# Patient Record
Sex: Male | Born: 1995
Health system: Southern US, Community
[De-identification: ages and names within clinical notes are randomized; demographics above are authoritative.]

## PROBLEM LIST (undated history)

## (undated) DIAGNOSIS — F988 Other specified behavioral and emotional disorders with onset usually occurring in childhood and adolescence: Secondary | ICD-10-CM

## (undated) HISTORY — DX: Other specified behavioral and emotional disorders with onset usually occurring in childhood and adolescence: F98.8

## (undated) HISTORY — PX: NO PAST SURGERIES: SHX2092

---
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
## 2016-07-15 ENCOUNTER — Encounter: Payer: Self-pay | Admitting: Family Medicine

## 2016-07-15 ENCOUNTER — Ambulatory Visit (INDEPENDENT_AMBULATORY_CARE_PROVIDER_SITE_OTHER): Payer: BLUE CROSS/BLUE SHIELD | Admitting: Family Medicine

## 2016-07-15 VITALS — BP 121/69 | HR 67 | Temp 98.0°F | Ht 74.6 in | Wt 177.0 lb

## 2016-07-15 DIAGNOSIS — M545 Low back pain: Secondary | ICD-10-CM | POA: Diagnosis not present

## 2016-07-15 DIAGNOSIS — F411 Generalized anxiety disorder: Secondary | ICD-10-CM | POA: Diagnosis not present

## 2016-07-15 DIAGNOSIS — F329 Major depressive disorder, single episode, unspecified: Secondary | ICD-10-CM | POA: Diagnosis not present

## 2016-07-15 DIAGNOSIS — F32A Depression, unspecified: Secondary | ICD-10-CM | POA: Insufficient documentation

## 2016-07-15 DIAGNOSIS — R3911 Hesitancy of micturition: Secondary | ICD-10-CM

## 2016-07-15 DIAGNOSIS — F419 Anxiety disorder, unspecified: Secondary | ICD-10-CM | POA: Insufficient documentation

## 2016-07-15 MED ORDER — CITALOPRAM HYDROBROMIDE 20 MG PO TABS: ORAL_TABLET | ORAL | 1 refills | Status: DC

## 2016-07-15 NOTE — Assessment & Plan Note (Signed)
Does not want counseling at this time. Will start celexa. Risks and benefits discussed. Recheck in 2 weeks. Call with concerns.

## 2016-07-15 NOTE — Progress Notes (Signed)
BP 121/69 (BP Location: Left Arm, Patient Position: Sitting, Cuff Size: Large)   Pulse 67   Temp 98 F (36.7 C)   Ht 6' 2.6" (1.895 m)   Wt 177 lb (80.3 kg)   SpO2 97%   BMI 22.36 kg/m    Subjective:    Patient ID: Anthony Wade, male    DOB: 12-28-1995, 20 y.o.   MRN: [id number]  HPI: Anthony Wade is a 20 y.o. male  Chief Complaint  Patient presents with  . Depression  . Anxiety  . Back Pain  . decreased urination   DEPRESSION/ANXIETY- been going on since Middle school Mood status: exacerbated Satisfied with current treatment?: no Symptom severity: moderate  Psychotherapy/counseling: yes in the past- didn't like it Depressed mood: yes Anxious mood: yes Anhedonia: yes Significant weight loss or gain: no Insomnia: no  Fatigue: yes Feelings of worthlessness or guilt: yes Impaired concentration/indecisiveness: yes Suicidal ideations: yes- passive, no plan Hopelessness: yes Crying spells: yes Depression screen PHQ 2/9 07/15/2016  Decreased Interest 1  Down, Depressed, Hopeless 2  PHQ - 2 Score 3  Altered sleeping 3  Tired, decreased energy 2  Change in appetite 2  Feeling bad or failure about yourself  1  Trouble concentrating 0  Moving slowly or fidgety/restless 1  Suicidal thoughts 1  PHQ-9 Score 13   GAD 7 : Generalized Anxiety Score 07/15/2016  Nervous, Anxious, on Edge 2  Control/stop worrying 2  Worry too much - different things 2  Trouble relaxing 3  Restless 2  Easily annoyed or irritable 3  Afraid - awful might happen 0  Total GAD 7 Score 14  Anxiety Difficulty Somewhat difficult     BACK PAIN Duration: 4 years, worse over the past couple of years Mechanism of injury: no trauma Location: bilateral and low back Onset: gradual Severity: mild Quality: aching Frequency: intermittent Radiation: none Aggravating factors: none Alleviating factors: nothing Status: stable Treatments attempted: none  Relief with NSAIDs?: No NSAIDs  Taken Nighttime pain:  no Paresthesias / decreased sensation:  no Bowel / bladder incontinence:  no Fevers:  no Dysuria / urinary frequency:  no  URINARY SYMPTOMS Duration: several weeks Dysuria: burning Urinary frequency: no Urgency: no Small volume voids: no Symptom severity: moderate Urinary incontinence: no Foul odor: yes Hematuria: no Abdominal pain: no Back pain: yes Suprapubic pain/pressure: no Flank pain: no Fever:  no Vomiting: no Status: stable Previous urinary tract infection: no Recurrent urinary tract infection: no Penile discharge: no Treatments attempted: none   Active Ambulatory Problems    Diagnosis Date Noted  . Depression 07/15/2016  . Anxiety disorder 07/15/2016   Resolved Ambulatory Problems    Diagnosis Date Noted  . Acute anxiety 07/15/2016   Past Medical History:  Diagnosis Date  . ADD (attention deficit disorder)    No Known Allergies  History reviewed. No pertinent surgical history.  Social History   Social History  . Marital status: Single    Spouse name: N/A  . Number of children: N/A  . Years of education: N/A   Occupational History  . Not on file.   Social History Main Topics  . Smoking status: Current Every Day Smoker    Packs/day: 0.25    Types: Cigarettes  . Smokeless tobacco: Never Used  . Alcohol use Yes     Comment: On occasion  . Drug use:     Types: Marijuana  . Sexual activity: No   Other Topics Concern  . Not on file  Social History Narrative  . No narrative on file   Family History  Problem Relation Age of Onset  . Asthma Mother   . Mental illness Mother     anxiety, bipolar depression  . Intestinal malrotation Mother   . ADD / ADHD Sister   . ADD / ADHD Brother   . Diabetes Maternal Grandmother   . Alcohol abuse Maternal Grandfather      Review of Systems  Constitutional: Negative.   Respiratory: Negative.   Cardiovascular: Negative.   Genitourinary: Positive for decreased urine  volume, difficulty urinating and dysuria. Negative for discharge, enuresis, flank pain, frequency, genital sores, hematuria, penile pain, penile swelling, scrotal swelling, testicular pain and urgency.  Musculoskeletal: Positive for back pain and myalgias. Negative for arthralgias, gait problem, joint swelling, neck pain and neck stiffness.  Psychiatric/Behavioral: Positive for decreased concentration and dysphoric mood. Negative for agitation, behavioral problems, confusion, hallucinations, self-injury, sleep disturbance and suicidal ideas. The patient is nervous/anxious. The patient is not hyperactive.     Per HPI unless specifically indicated above     Objective:    BP 121/69 (BP Location: Left Arm, Patient Position: Sitting, Cuff Size: Large)   Pulse 67   Temp 98 F (36.7 C)   Ht 6' 2.6" (1.895 m)   Wt 177 lb (80.3 kg)   SpO2 97%   BMI 22.36 kg/m   Wt Readings from Last 3 Encounters:  07/15/16 177 lb (80.3 kg) (81 %, Z= 0.88)*   * Growth percentiles are based on CDC 2-20 Years data.    Physical Exam  Constitutional: He is oriented to person, place, and time. He appears well-developed and well-nourished. No distress.  HENT:  Head: Normocephalic and atraumatic.  Right Ear: Hearing normal.  Left Ear: Hearing normal.  Nose: Nose normal.  Eyes: Conjunctivae and lids are normal. Right eye exhibits no discharge. Left eye exhibits no discharge. No scleral icterus.  Cardiovascular: Normal rate, regular rhythm, normal heart sounds and intact distal pulses.  Exam reveals no gallop and no friction rub.   No murmur heard. Pulmonary/Chest: Effort normal and breath sounds normal. No respiratory distress. He has no wheezes. He has no rales. He exhibits no tenderness.  Neurological: He is alert and oriented to person, place, and time.  Skin: Skin is warm, dry and intact. No rash noted. He is not diaphoretic. No erythema. No pallor.  Psychiatric: He has a normal mood and affect. His speech is  normal and behavior is normal. Judgment and thought content normal. Cognition and memory are normal.  Nursing note and vitals reviewed. Back Exam:    Inspection:    Curvature: Scoliotic- L lumbar  Deformity: no  Ecchymosis: no   Erythema:  no   Lesions: no    Palpation:     Midline spinal tenderness: no      Paralumbar tenderness: yes      Parathoracic tenderness: no      Buttocks tenderness: no     Range of Motion:      Flexion: Fingers to Knees     Extension:Decreased     Lateral bending:Decreased    Rotation:Decreased    Neuro Exam:Lower extremity DTRs normal & symmetric.  Strength and sensation intact.    Special Tests:      Straight leg raise:negative  Results for orders placed or performed in visit on 12/09/12  CBC  Result Value Ref Range   WBC 6.1 3.8 - 10.6 x10 3/mm 3   RBC 5.16 4.40 - 5.90  x10 6/mm 3   HGB 14.9 13.0 - 18.0 g/dL   HCT 40.942.4 81.140.0 - 91.452.0 %   MCV 82 80 - 100 fL   MCH 28.9 26.0 - 34.0 pg   MCHC 35.1 32.0 - 36.0 g/dL   RDW 78.213.7 95.611.5 - 21.314.5 %   Platelet 198 150 - 440 x10 3/mm 3  Comprehensive metabolic panel  Result Value Ref Range   Glucose 104 (H) 65 - 99 mg/dL   BUN 21 9 - 21 mg/dL   Creatinine 0.860.89 5.780.60 - 1.30 mg/dL   Sodium 469142 (H) 629132 - 141 mmol/L   Potassium 4.0 3.3 - 4.7 mmol/L   Chloride 105 97 - 107 mmol/L   Co2 30 (H) 16 - 25 mmol/L   Calcium, Total 9.2 (L) 9.3 - 10.7 mg/dL   SGOT(AST) 25 15 - 37 Unit/L   SGPT (ALT) 20 12 - 78 U/L   Alkaline Phosphatase 220 169 - 618 Unit/L   Albumin 4.2 3.8 - 5.6 g/dL   Total Protein 7.8 6.4 - 8.6 g/dL   Bilirubin,Total 0.4 0.2 - 1.0 mg/dL   Osmolality 528286 413275 - 301   Anion Gap 7 7 - 16  Ethanol  Result Value Ref Range   Ethanol < 3 mg/dL   Ethanol % < 2.4400.003 1.0270.000 - 0.080 %  TSH  Result Value Ref Range   Thyroid Stimulating Horm 2.52 uIU/mL  Urinalysis, Complete  Result Value Ref Range   Color - urine Yellow    Clarity - urine Clear    Glucose,UR Negative 0 - 75 mg/dL   Bilirubin,UR  Negative NEGATIVE   Ketone Negative NEGATIVE   Specific Gravity 1.014 1.003 - 1.030   Blood Negative NEGATIVE   Ph 6.0 4.5 - 8.0   Protein Negative NEGATIVE   Nitrite Negative NEGATIVE   Leukocyte Esterase Negative NEGATIVE   RBC,UR <1 /HPF 0 - 5 /HPF   WBC UR 1 /HPF 0 - 5 /HPF   Bacteria NONE SEEN NONE SEEN   Squamous Epithelial NONE SEEN    Mucous PRESENT   Drug Screen, Urine  Result Value Ref Range   Tricyclic, Ur Screen NEGATIVE Cutoff-1000 ng/mL   Amphetamines, Ur Screen NEGATIVE Cutoff-1000 ng/mL   MDMA (Ecstasy)Ur Screen NEGATIVE Cutoff-500 ng/mL   Cocaine Metabolite,Ur [place] NEGATIVE Cutoff-300 ng/mL   Opiate, Ur Screen NEGATIVE Cutoff-300 ng/mL   Phencyclidine (PCP) Ur S NEGATIVE Cutoff-25 ng/mL   Cannabinoid 50 Ng, Ur [place] NEGATIVE Cutoff-50 ng/mL   Barbiturates, Ur Screen NEGATIVE Cutoff-200 ng/mL   Methadone, Ur Screen NEGATIVE Cutoff-300 ng/mL   Benzodiazepine, Ur Scrn NEGATIVE Cutoff-200 ng/mL      Assessment & Plan:   Problem List Items Addressed This Visit      Other   Depression    Does not want counseling at this time. Will start celexa. Risks and benefits discussed. Recheck in 2 weeks. Call with concerns.       Relevant Medications   citalopram (CELEXA) 20 MG tablet   Anxiety disorder    Does not want counseling at this time. Will start celexa. Risks and benefits discussed. Recheck in 2 weeks. Call with concerns.        Other Visit Diagnoses    Urinary hesitancy    -  Primary   Will check UA and GC/chlamydia. Increase water intake. Follow up in 2 weeks.    Relevant Orders   UA/M w/rflx Culture, Routine   GC/Chlamydia Probe Amp   Low back pain without sciatica,  unspecified back pain laterality       Looks like he has some scoliosis. Will check x-ray. Stretches given. Call with concerns.    Relevant Orders   DG Lumbar Spine Complete   UA/M w/rflx Culture, Routine   GC/Chlamydia Probe Amp       Follow up plan: Return in about 2 weeks (around  07/29/2016) for follow up mood.

## 2016-07-15 NOTE — Assessment & Plan Note (Signed)
Does not want counseling at this time. Will start celexa. Risks and benefits discussed. Recheck in 2 weeks. Call with concerns.  

## 2016-07-15 NOTE — Patient Instructions (Addendum)

## 2016-07-23 ENCOUNTER — Ambulatory Visit
Admission: RE | Admit: 2016-07-23 | Discharge: 2016-07-23 | Disposition: A | Discharge status: Home / Self Care | Payer: BLUE CROSS/BLUE SHIELD | Source: Ambulatory Visit | Attending: Family Medicine | Admitting: Family Medicine

## 2016-07-23 DIAGNOSIS — M545 Low back pain: Secondary | ICD-10-CM | POA: Insufficient documentation

## 2016-07-24 ENCOUNTER — Encounter: Payer: Self-pay | Admitting: Family Medicine

## 2016-07-29 ENCOUNTER — Encounter: Payer: Self-pay | Admitting: Family Medicine

## 2016-07-29 ENCOUNTER — Ambulatory Visit (INDEPENDENT_AMBULATORY_CARE_PROVIDER_SITE_OTHER): Payer: BLUE CROSS/BLUE SHIELD | Admitting: Family Medicine

## 2016-07-29 DIAGNOSIS — F329 Major depressive disorder, single episode, unspecified: Secondary | ICD-10-CM

## 2016-07-29 DIAGNOSIS — F1220 Cannabis dependence, uncomplicated: Secondary | ICD-10-CM | POA: Diagnosis not present

## 2016-07-29 DIAGNOSIS — F32A Depression, unspecified: Secondary | ICD-10-CM

## 2016-07-29 NOTE — Assessment & Plan Note (Addendum)
Not doing well. Smoking at lot. Took too much of his medicine. Unclear on safety. Mobile Crisis Unit called and Anthony Wade came out to do an assessment. Discussed with Mom, Step-Dad and [name]. Not a danger to himself or others at this time. No need for IVC. List of counselors given to patient and his family today. Patient to continue his celexa as long as parents ar in control of bottle and give it to him as prescribed. Will keep very close follow up with appointment in 1 week.

## 2016-07-29 NOTE — Assessment & Plan Note (Signed)
Also using drugs and xanax in the past. Parents want him to go to rehab. Mobile Crisis Unit called today.

## 2016-07-29 NOTE — Progress Notes (Signed)
BP 125/78 (BP Location: Left Arm, Patient Position: Sitting, Cuff Size: Normal)   Pulse 74   Temp 97.7 F (36.5 C)   Wt 183 lb (83 kg)   SpO2 99%   BMI 23.12 kg/m    Subjective:    Patient ID: Anthony Wade, male    DOB: 03-15-1996, 20 y.o.   MRN: [id number]  HPI: Anthony Wade is a 20 y.o. male here today with his Mom  Chief Complaint  Patient presents with  . Depression   DEPRESSION- Mom is very concerned about him. States that the past week has been bad. She took his medicine away from him because he took too much of it. States that last week he took 3-4 of his medicine. He got kicked out of his house on Saturday for not coming home to take care of his sister. Mom notes that he has been smoking at lot. He has been smoking marijuana several times a day and drinking a lot.  Mood status: exacerbated Satisfied with current treatment?: no Symptom severity: severe  Duration of current treatment : 2 weeks Side effects: no Medication compliance: poor compliance Psychotherapy/counseling: no  Previous psychiatric medications: none Depressed mood: yes Anxious mood: yes Anhedonia: yes Significant weight loss or gain: no Insomnia: yes hard to fall asleep Fatigue: yes Feelings of worthlessness or guilt: yes Impaired concentration/indecisiveness: no Suicidal ideations: no Hopelessness: yes Crying spells: no Depression screen [facility] 2/9 07/29/2016 07/15/2016  Decreased Interest 3 1  Down, Depressed, Hopeless 3 2  PHQ - 2 Score 6 3  Altered sleeping 3 3  Tired, decreased energy 3 2  Change in appetite 2 2  Feeling bad or failure about yourself  2 1  Trouble concentrating 2 0  Moving slowly or fidgety/restless 2 1  Suicidal thoughts 0 1  PHQ-9 Score 20 13    Relevant past medical, surgical, family and social history reviewed and updated as indicated. Interim medical history since our last visit reviewed. Allergies and medications reviewed and updated.  Review of Systems    Constitutional: Negative.   Respiratory: Negative.   Cardiovascular: Negative.   Psychiatric/Behavioral: Positive for behavioral problems, dysphoric mood and sleep disturbance. Negative for agitation, confusion, decreased concentration, hallucinations, self-injury and suicidal ideas. The patient is nervous/anxious. The patient is not hyperactive.     Per HPI unless specifically indicated above     Objective:    BP 125/78 (BP Location: Left Arm, Patient Position: Sitting, Cuff Size: Normal)   Pulse 74   Temp 97.7 F (36.5 C)   Wt 183 lb (83 kg)   SpO2 99%   BMI 23.12 kg/m   Wt Readings from Last 3 Encounters:  07/29/16 183 lb (83 kg) (85 %, Z= 1.05)*  07/15/16 177 lb (80.3 kg) (81 %, Z= 0.88)*   * Growth percentiles are based on CDC 2-20 Years data.    Physical Exam  Constitutional: He is oriented to person, place, and time. He appears well-developed and well-nourished. No distress.  HENT:  Head: Normocephalic and atraumatic.  Right Ear: Hearing normal.  Left Ear: Hearing normal.  Nose: Nose normal.  Eyes: Conjunctivae and lids are normal. Right eye exhibits no discharge. Left eye exhibits no discharge. No scleral icterus.  Cardiovascular: Normal rate, regular rhythm, normal heart sounds and intact distal pulses.  Exam reveals no gallop and no friction rub.   No murmur heard. Pulmonary/Chest: Effort normal and breath sounds normal. No respiratory distress. He has no wheezes. He has no rales. He  exhibits no tenderness.  Musculoskeletal: Normal range of motion.  Neurological: He is alert and oriented to person, place, and time.  Skin: Skin is warm, dry and intact. No rash noted. No erythema. No pallor.  Psychiatric: His speech is normal and behavior is normal. Judgment and thought content normal. His affect is blunt. Cognition and memory are normal.  Nursing note and vitals reviewed.   Results for orders placed or performed in visit on 12/09/12  CBC  Result Value Ref Range    WBC 6.1 3.8 - 10.6 x10 3/mm 3   RBC 5.16 4.40 - 5.90 x10 6/mm 3   HGB 14.9 13.0 - 18.0 g/dL   HCT 96.042.4 45.440.0 - 09.852.0 %   MCV 82 80 - 100 fL   MCH 28.9 26.0 - 34.0 pg   MCHC 35.1 32.0 - 36.0 g/dL   RDW 11.913.7 14.711.5 - 82.914.5 %   Platelet 198 150 - 440 x10 3/mm 3  Comprehensive metabolic panel  Result Value Ref Range   Glucose 104 (H) 65 - 99 mg/dL   BUN 21 9 - 21 mg/dL   Creatinine 5.620.89 1.300.60 - 1.30 mg/dL   Sodium 865142 (H) 784132 - 141 mmol/L   Potassium 4.0 3.3 - 4.7 mmol/L   Chloride 105 97 - 107 mmol/L   Co2 30 (H) 16 - 25 mmol/L   Calcium, Total 9.2 (L) 9.3 - 10.7 mg/dL   SGOT(AST) 25 15 - 37 Unit/L   SGPT (ALT) 20 12 - 78 U/L   Alkaline Phosphatase 220 169 - 618 Unit/L   Albumin 4.2 3.8 - 5.6 g/dL   Total Protein 7.8 6.4 - 8.6 g/dL   Bilirubin,Total 0.4 0.2 - 1.0 mg/dL   Osmolality 696286 295275 - 301   Anion Gap 7 7 - 16  Ethanol  Result Value Ref Range   Ethanol < 3 mg/dL   Ethanol % < 2.8410.003 3.2440.000 - 0.080 %  TSH  Result Value Ref Range   Thyroid Stimulating Horm 2.52 uIU/mL  Urinalysis, Complete  Result Value Ref Range   Color - urine Yellow    Clarity - urine Clear    Glucose,UR Negative 0 - 75 mg/dL   Bilirubin,UR Negative NEGATIVE   Ketone Negative NEGATIVE   Specific Gravity 1.014 1.003 - 1.030   Blood Negative NEGATIVE   Ph 6.0 4.5 - 8.0   Protein Negative NEGATIVE   Nitrite Negative NEGATIVE   Leukocyte Esterase Negative NEGATIVE   RBC,UR <1 /HPF 0 - 5 /HPF   WBC UR 1 /HPF 0 - 5 /HPF   Bacteria NONE SEEN NONE SEEN   Squamous Epithelial NONE SEEN    Mucous PRESENT   Drug Screen, Urine  Result Value Ref Range   Tricyclic, Ur Screen NEGATIVE Cutoff-1000 ng/mL   Amphetamines, Ur Screen NEGATIVE Cutoff-1000 ng/mL   MDMA (Ecstasy)Ur Screen NEGATIVE Cutoff-500 ng/mL   Cocaine Metabolite,Ur [place] NEGATIVE Cutoff-300 ng/mL   Opiate, Ur Screen NEGATIVE Cutoff-300 ng/mL   Phencyclidine (PCP) Ur S NEGATIVE Cutoff-25 ng/mL   Cannabinoid 50 Ng, Ur [place] NEGATIVE Cutoff-50 ng/mL    Barbiturates, Ur Screen NEGATIVE Cutoff-200 ng/mL   Methadone, Ur Screen NEGATIVE Cutoff-300 ng/mL   Benzodiazepine, Ur Scrn NEGATIVE Cutoff-200 ng/mL      Assessment & Plan:   Problem List Items Addressed This Visit      Other   Depression    Not doing well. Smoking at lot. Took too much of his medicine. Unclear on safety. Mobile Crisis Unit called and  Ree KidaJack came out to do an assessment. Discussed with Mom, Step-Dad and [name]. Not a danger to himself or others at this time. No need for IVC. List of counselors given to patient and his family today. Patient to continue his celexa as long as parents ar in control of bottle and give it to him as prescribed. Will keep very close follow up with appointment in 1 week.       Addiction, marijuana (HCC)    Also using drugs and xanax in the past. Parents want him to go to rehab. Mobile Crisis Unit called today.       Other Visit Diagnoses   None.      Follow up plan: Return in about 1 week (around 08/05/2016) for Follow up.

## 2016-08-04 ENCOUNTER — Ambulatory Visit: Payer: BLUE CROSS/BLUE SHIELD | Admitting: Family Medicine

## 2016-09-05 ENCOUNTER — Encounter: Payer: Self-pay | Admitting: Family Medicine

## 2016-09-05 ENCOUNTER — Ambulatory Visit (INDEPENDENT_AMBULATORY_CARE_PROVIDER_SITE_OTHER): Payer: BLUE CROSS/BLUE SHIELD | Admitting: Family Medicine

## 2016-09-05 DIAGNOSIS — F909 Attention-deficit hyperactivity disorder, unspecified type: Secondary | ICD-10-CM | POA: Diagnosis not present

## 2016-09-05 DIAGNOSIS — F329 Major depressive disorder, single episode, unspecified: Secondary | ICD-10-CM | POA: Diagnosis not present

## 2016-09-05 DIAGNOSIS — F411 Generalized anxiety disorder: Secondary | ICD-10-CM

## 2016-09-05 DIAGNOSIS — F32A Depression, unspecified: Secondary | ICD-10-CM

## 2016-09-05 MED ORDER — ATOMOXETINE HCL 40 MG PO CAPS
ORAL_CAPSULE | ORAL | 1 refills | Status: DC
Start: 2016-09-05 — End: 2019-02-22

## 2016-09-05 NOTE — Assessment & Plan Note (Signed)
Discussed with both [name] and Mom, that I cannot prescribe him controlled substances right now as I know he has been taking pills and using drugs recreationally. I offered him a referral to psychiatry to get his ADD treated vs. Trying a non-controlled medication. He is willing to try strattera and they will decide if they want to see psych. Rx given today. Recheck in 4 weeks to see how he's doing.

## 2016-09-05 NOTE — Assessment & Plan Note (Signed)
Doing well off his medicine. Does not want to take medicine now. Continue to monitor closely.

## 2016-09-05 NOTE — Assessment & Plan Note (Signed)
Doing well off his medicine. Does not want to take medicine now. Continue to monitor closely. 

## 2016-09-05 NOTE — Progress Notes (Addendum)
BP 126/79 (BP Location: Left Arm, Patient Position: Sitting, Cuff Size: Large)   Pulse 71   Temp 97.5 F (36.4 C)   Wt 186 lb (84.4 kg)   SpO2 97%   BMI 23.50 kg/m    Subjective:    Patient ID: Anthony Wade, male    DOB: 01-Feb-1996, 20 y.o.   MRN: [id number]  HPI: Anthony Wade is a 20 y.o. male  Chief Complaint  Patient presents with  . Anxiety  . ADHD    Patients mother would like to discuss ADHD, and the possibilty of starting him on medication again.   ANXIETY/STRESS- doing much better. Living at home now. Feeling good. Off celexa. Made him feel weird Duration:better Anxious mood: yes  Excessive worrying: yes Irritability: yes  Sweating: no Nausea: no Palpitations:no Hyperventilation: no Panic attacks: no Agoraphobia: no  Obscessions/compulsions: no Depressed mood: yes [facility] 09/05/2016 07/29/2016 07/15/2016  Decreased Interest 1 3 1   Down, Depressed, Hopeless 1 3 2   PHQ - 2 Score 2 6 3   Altered sleeping - 3 3  Tired, decreased energy - 3 2  Change in appetite - 2 2  Feeling bad or failure about yourself  - 2 1  Trouble concentrating - 2 0  Moving slowly or fidgety/restless - 2 1  Suicidal thoughts - 0 1  PHQ-9 Score - 20 13   GAD 7 : Generalized Anxiety Score 09/05/2016 07/29/2016 07/15/2016  Nervous, Anxious, on Edge 1 2 2   Control/stop worrying 1 1 2   Worry too much - different things 0 1 2  Trouble relaxing 0 1 3  Restless 0 1 2  Easily annoyed or irritable 2 2 3   Afraid - awful might happen 1 2 0  Total GAD 7 Score 5 10 14   Anxiety Difficulty Not difficult at all Somewhat difficult Somewhat difficult   Anhedonia: no Weight changes: no Insomnia: no   Hypersomnia: no Fatigue/loss of energy: yes Feelings of worthlessness: no Feelings of guilt: no Impaired concentration/indecisiveness: yes Suicidal ideations: no  Crying spells: no Recent Stressors/Life Changes: yes   Financial stress: yes   ADHD- diagnosed in childhood, was  on medication. Has been off medicine for several years. Starting to notice a differnece ADHD status: exacerbated Satisfied with current therapy: no Previous psychiatry evaluation: no Previous medications: yes adderall and ritalin SR   Work/school performance:  fair Difficulty sustaining attention/completing tasks: yes Distracted by extraneous stimuli: yes Does not listen when spoken to: yes  Fidgets with hands or feet: yes Unable to stay in seat: no Blurts out/interrupts others: no  Relevant past medical, surgical, family and social history reviewed and updated as indicated. Interim medical history since our last visit reviewed. Allergies and medications reviewed and updated.  Review of Systems  Constitutional: Negative.   Respiratory: Negative.   Cardiovascular: Negative.   Neurological: Negative.   Psychiatric/Behavioral: Positive for decreased concentration. Negative for agitation, behavioral problems, confusion, dysphoric mood, hallucinations, self-injury, sleep disturbance and suicidal ideas. The patient is nervous/anxious. The patient is not hyperactive.     Per HPI unless specifically indicated above     Objective:    BP 126/79 (BP Location: Left Arm, Patient Position: Sitting, Cuff Size: Large)   Pulse 71   Temp 97.5 F (36.4 C)   Wt 186 lb (84.4 kg)   SpO2 97%   BMI 23.50 kg/m   Wt Readings from Last 3 Encounters:  09/05/16 186 lb (84.4 kg) (87 %, Z= 1.13)*  07/29/16 183 lb (  83 kg) (85 %, Z= 1.05)*  07/15/16 177 lb (80.3 kg) (81 %, Z= 0.88)*   * Growth percentiles are based on CDC 2-20 Years data.    Physical Exam  Constitutional: He is oriented to person, place, and time. He appears well-developed and well-nourished. No distress.  HENT:  Head: Normocephalic and atraumatic.  Right Ear: Hearing normal.  Left Ear: Hearing normal.  Nose: Nose normal.  Eyes: Conjunctivae and lids are normal. Right eye exhibits no discharge. Left eye exhibits no discharge. No  scleral icterus.  Cardiovascular: Normal rate, regular rhythm, normal heart sounds and intact distal pulses.  Exam reveals no gallop and no friction rub.   No murmur heard. Pulmonary/Chest: Effort normal and breath sounds normal. No respiratory distress. He has no wheezes. He has no rales. He exhibits no tenderness.  Musculoskeletal: Normal range of motion.  Neurological: He is alert and oriented to person, place, and time.  Skin: Skin is warm, dry and intact. No rash noted. He is not diaphoretic. No erythema. No pallor.  Psychiatric: He has a normal mood and affect. His speech is normal and behavior is normal. Judgment and thought content normal. Cognition and memory are normal.  Nursing note and vitals reviewed.   Results for orders placed or performed in visit on 12/09/12  CBC  Result Value Ref Range   WBC 6.1 3.8 - 10.6 x10 3/mm 3   RBC 5.16 4.40 - 5.90 x10 6/mm 3   HGB 14.9 13.0 - 18.0 g/dL   HCT 16.1 09.6 - 04.5 %   MCV 82 80 - 100 fL   MCH 28.9 26.0 - 34.0 pg   MCHC 35.1 32.0 - 36.0 g/dL   RDW 40.9 81.1 - 91.4 %   Platelet 198 150 - 440 x10 3/mm 3  Comprehensive metabolic panel  Result Value Ref Range   Glucose 104 (H) 65 - 99 mg/dL   BUN 21 9 - 21 mg/dL   Creatinine 7.82 9.56 - 1.30 mg/dL   Sodium 213 (H) 086 - 141 mmol/L   Potassium 4.0 3.3 - 4.7 mmol/L   Chloride 105 97 - 107 mmol/L   Co2 30 (H) 16 - 25 mmol/L   Calcium, Total 9.2 (L) 9.3 - 10.7 mg/dL   SGOT(AST) 25 15 - 37 Unit/L   SGPT (ALT) 20 12 - 78 U/L   Alkaline Phosphatase 220 169 - 618 Unit/L   Albumin 4.2 3.8 - 5.6 g/dL   Total Protein 7.8 6.4 - 8.6 g/dL   Bilirubin,Total 0.4 0.2 - 1.0 mg/dL   Osmolality 578 469 - 301   Anion Gap 7 7 - 16  Ethanol  Result Value Ref Range   Ethanol < 3 mg/dL   Ethanol % < 6.295 2.841 - 0.080 %  TSH  Result Value Ref Range   Thyroid Stimulating Horm 2.52 uIU/mL  Urinalysis, Complete  Result Value Ref Range   Color - urine Yellow    Clarity - urine Clear     Glucose,UR Negative 0 - 75 mg/dL   Bilirubin,UR Negative NEGATIVE   Ketone Negative NEGATIVE   Specific Gravity 1.014 1.003 - 1.030   Blood Negative NEGATIVE   Ph 6.0 4.5 - 8.0   Protein Negative NEGATIVE   Nitrite Negative NEGATIVE   Leukocyte Esterase Negative NEGATIVE   RBC,UR <1 /HPF 0 - 5 /HPF   WBC UR 1 /HPF 0 - 5 /HPF   Bacteria NONE SEEN NONE SEEN   Squamous Epithelial NONE SEEN  Mucous PRESENT   Drug Screen, Urine  Result Value Ref Range   Tricyclic, Ur Screen NEGATIVE Cutoff-1000 ng/mL   Amphetamines, Ur Screen NEGATIVE Cutoff-1000 ng/mL   MDMA (Ecstasy)Ur Screen NEGATIVE Cutoff-500 ng/mL   Cocaine Metabolite,Ur Beecher NEGATIVE Cutoff-300 ng/mL   Opiate, Ur Screen NEGATIVE Cutoff-300 ng/mL   Phencyclidine (PCP) Ur S NEGATIVE Cutoff-25 ng/mL   Cannabinoid 50 Ng, Ur [place] NEGATIVE Cutoff-50 ng/mL   Barbiturates, Ur Screen NEGATIVE Cutoff-200 ng/mL   Methadone, Ur Screen NEGATIVE Cutoff-300 ng/mL   Benzodiazepine, Ur Scrn NEGATIVE Cutoff-200 ng/mL      Assessment & Plan:   Problem List Items Addressed This Visit      Other   Depression    Doing well off his medicine. Does not want to take medicine now. Continue to monitor closely.      Anxiety disorder    Doing well off his medicine. Does not want to take medicine now. Continue to monitor closely.      ADHD (attention deficit hyperactivity disorder)    Discussed with both Shalon and Mom, that I cannot prescribe him controlled substances right now as I know he has been taking pills and using drugs recreationally. I offered him a referral to psychiatry to get his ADD treated vs. Trying a non-controlled medication. He is willing to try strattera and they will decide if they want to see psych. Rx given today. Recheck in 4 weeks to see how he's doing.        Other Visit Diagnoses   None.      Follow up plan: Return in about 4 weeks (around 10/03/2016) for Follow up ADD.

## 2016-09-05 NOTE — Patient Instructions (Addendum)
Atomoxetine capsules  What is this medicine?  ATOMOXETINE (AT oh mox e teen) is used to treat attention deficit/hyperactivity disorder, also known as ADHD. It is not a stimulant like other drugs for ADHD. This drug can improve attention span, concentration, and emotional control. It can also reduce restless or overactive behavior.  This medicine may be used for other purposes; ask your health care provider or pharmacist if you have questions.  What should I tell my health care provider before I take this medicine?  They need to know if you have any of these conditions:  -glaucoma  -high or low blood pressure  -history of stroke  -irregular heartbeat or other cardiac disease  -liver disease  -mania or bipolar disorder  -pheochromocytoma  -suicidal thoughts  -an unusual or allergic reaction to atomoxetine, other medicines, foods, dyes, or preservatives  -pregnant or trying to get pregnant  -breast-feeding  How should I use this medicine?  Take this medicine by mouth with a glass of water. Follow the directions on the prescription label. You can take it with or without food. If it upsets your stomach, take it with food. If you have difficulty sleeping and you take more than 1 dose per day, take your last dose before 6 PM. Take your medicine at regular intervals. Do not take it more often than directed. Do not stop taking except on your doctor's advice.  A special MedGuide will be given to you by the pharmacist with each prescription and refill. Be sure to read this information carefully each time.  Talk to your pediatrician regarding the use of this medicine in children. While this drug may be prescribed for children as young as 6 years for selected conditions, precautions do apply.  Overdosage: If you think you have taken too much of this medicine contact a poison control center or emergency room at once.  NOTE: This medicine is only for you. Do not share this medicine with others.  What if I miss a dose?  If you miss  a dose, take it as soon as you can. If it is almost time for your next dose, take only that dose. Do not take double or extra doses.  What may interact with this medicine?  Do not take this medicine with any of the following medications:  -cisapride  -dofetilide  -dronedarone  -MAOIs like Carbex, Eldepryl, Marplan, Nardil, and Parnate  -pimozide  -reboxetine  -thioridazine  -ziprasidone  This medicine may also interact with the following medications:  -certain medicines for blood pressure, heart disease, irregular heart beat  -certain medicines for depression, anxiety, or psychotic disturbances  -certain medicines for lung disease like albuterol  -cold or allergy medicines  -fluoxetine  -medicines that increase blood pressure like dopamine, dobutamine, or ephedrine  -other medicines that prolong the QT interval (cause an abnormal heart rhythm)  -paroxetine  -quinidine  -stimulant medicines for attention disorders, weight loss, or to stay awake  This list may not describe all possible interactions. Give your health care provider a list of all the medicines, herbs, non-prescription drugs, or dietary supplements you use. Also tell them if you smoke, drink alcohol, or use illegal drugs. Some items may interact with your medicine.  What should I watch for while using this medicine?  It may take a week or more for this medicine to take effect. This is why it is very important to continue taking the medicine and not miss any doses. If you have been taking this medicine regularly   for some time, do not suddenly stop taking it. Ask your doctor or health care professional for advice.  Rarely, this medicine may increase thoughts of suicide or suicide attempts in children and teenagers. Call your child's health care professional right away if your child or teenager has new or increased thoughts of suicide or has changes in mood or behavior like becoming irritable or anxious. Regularly monitor your child for these behavioral  changes.  For males, contact you doctor or health care professional right away if you have an erection that lasts longer than 4 hours or if it becomes painful. This may be a sign of serious problem and must be treated right away to prevent permanent damage.  You may get drowsy or dizzy. Do not drive, use machinery, or do anything that needs mental alertness until you know how this medicine affects you. Do not stand or sit up quickly, especially if you are an older patient. This reduces the risk of dizzy or fainting spells. Alcohol can make you more drowsy and dizzy. Avoid alcoholic drinks.  Do not treat yourself for coughs, colds or allergies without asking your doctor or health care professional for advice. Some ingredients can increase possible side effects.  Your mouth may get dry. Chewing sugarless gum or sucking hard candy, and drinking plenty of water will help.  What side effects may I notice from receiving this medicine?  Side effects that you should report to your doctor or health care professional as soon as possible:  -allergic reactions like skin rash, itching or hives, swelling of the face, lips, or tongue  -breathing problems  -chest pain  -dark urine  -fast, irregular heartbeat  -general ill feeling or flu-like symptoms  -high blood pressure  -males: prolonged or painful erection  -stomach pain or tenderness  -trouble passing urine or change in the amount of urine  -vomiting  -weight loss  -yellowing of the eyes or skin  Side effects that usually do not require medical attention (report to your doctor or health care professional if they continue or are bothersome):  -change in sex drive or performance  -constipation or diarrhea  -headache  -loss of appetite  -menstrual period irregularities  -nausea  -stomach upset  This list may not describe all possible side effects. Call your doctor for medical advice about side effects. You may report side effects to FDA at 1-800-FDA-1088.  Where should I keep my  medicine?  Keep out of the reach of children.  Store at room temperature between 15 and 30 degrees C (59 and 86 degrees F). Throw away any unused medication after the expiration date.  NOTE: This sheet is a summary. It may not cover all possible information. If you have questions about this medicine, talk to your doctor, pharmacist, or health care provider.     © 2016, [employer]. (2014-04-07 15:29:22)

## 2016-10-17 ENCOUNTER — Ambulatory Visit: Payer: BLUE CROSS/BLUE SHIELD | Admitting: Family Medicine

## 2017-10-01 IMAGING — DX DG LUMBAR SPINE COMPLETE 4+V
6 series · 6 of 6 positions shown · non-contrast
Comparison: None.

CLINICAL DATA: Low back pain over the last 3-4 years.

EXAM:
LUMBAR SPINE - COMPLETE 4+ VIEW

[l-spine ap]
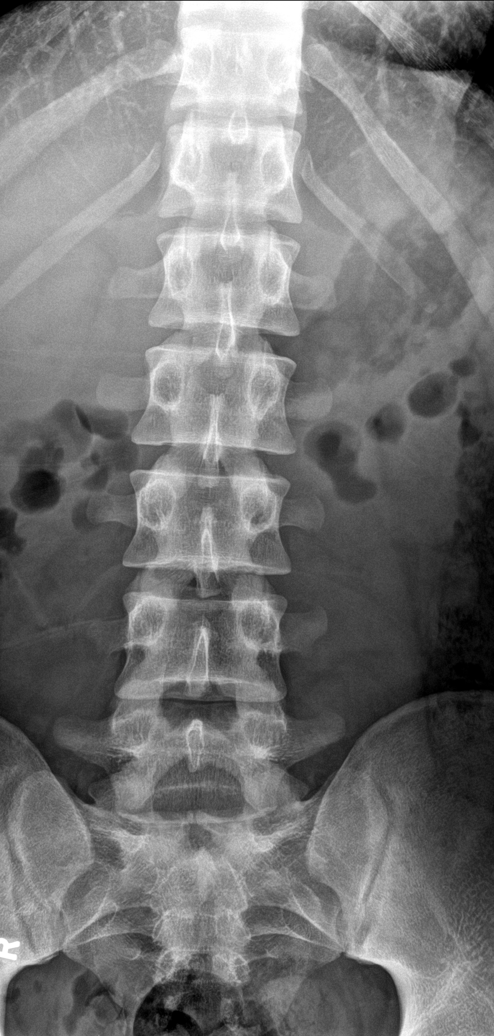

[l-spine obl (1 of 3)]
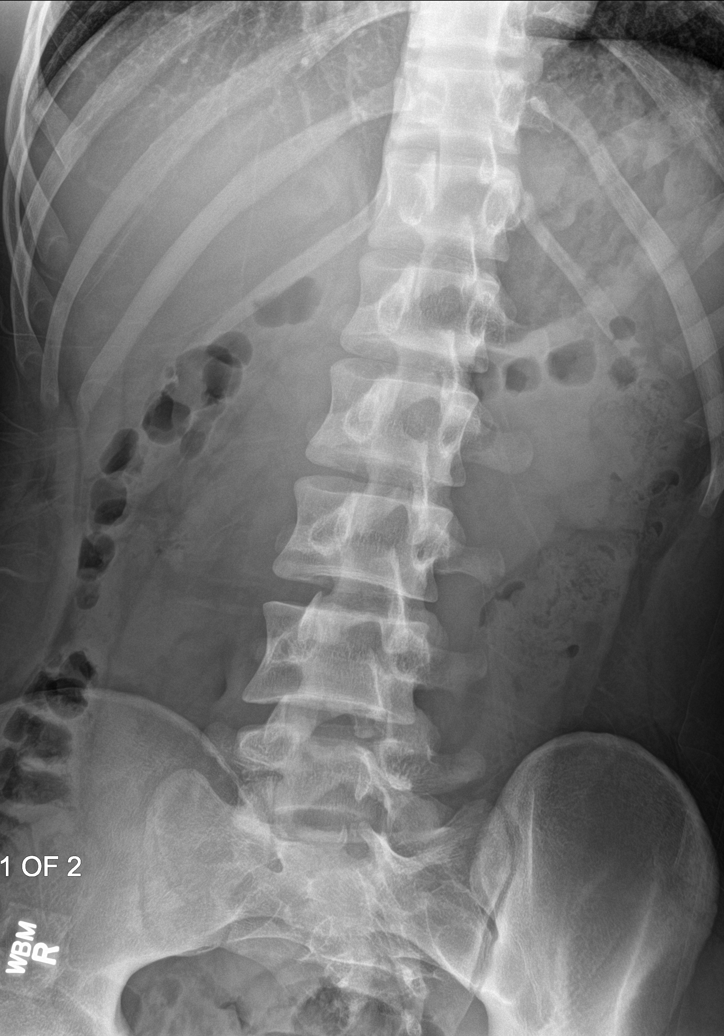

[l-spine obl (2 of 3)]
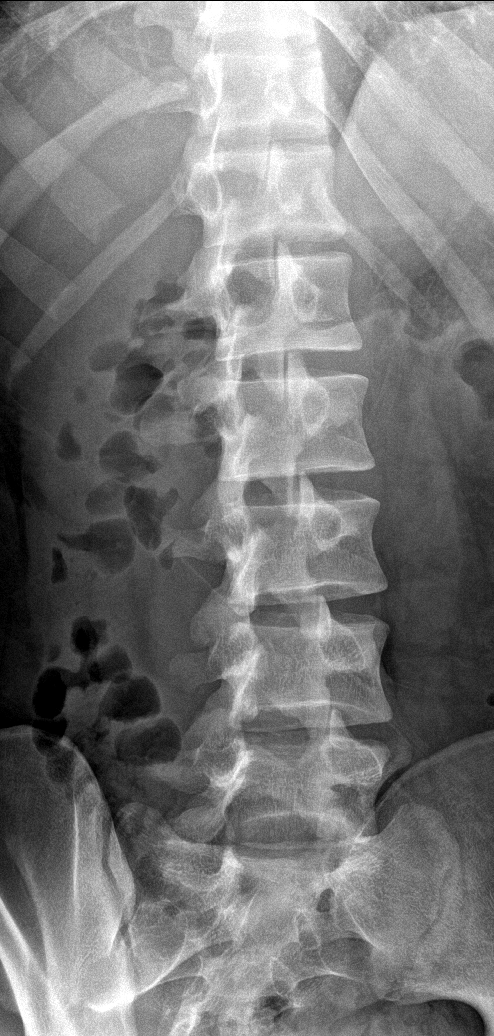

[l-spine lat]
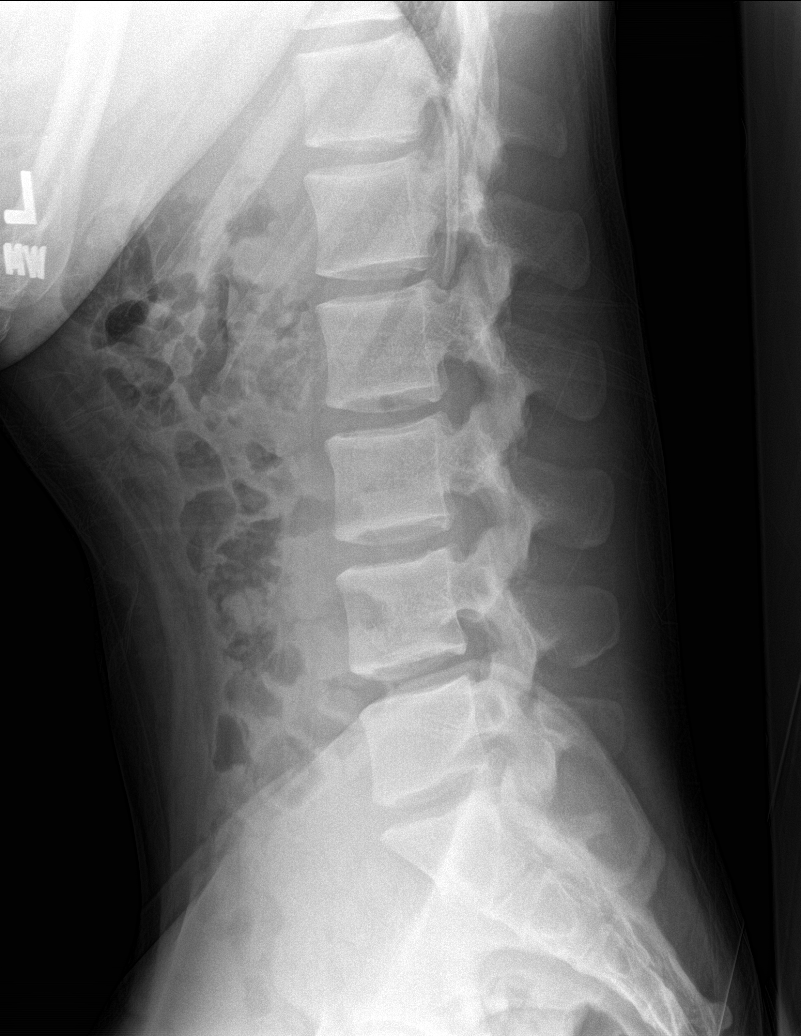

[l-spine spot]
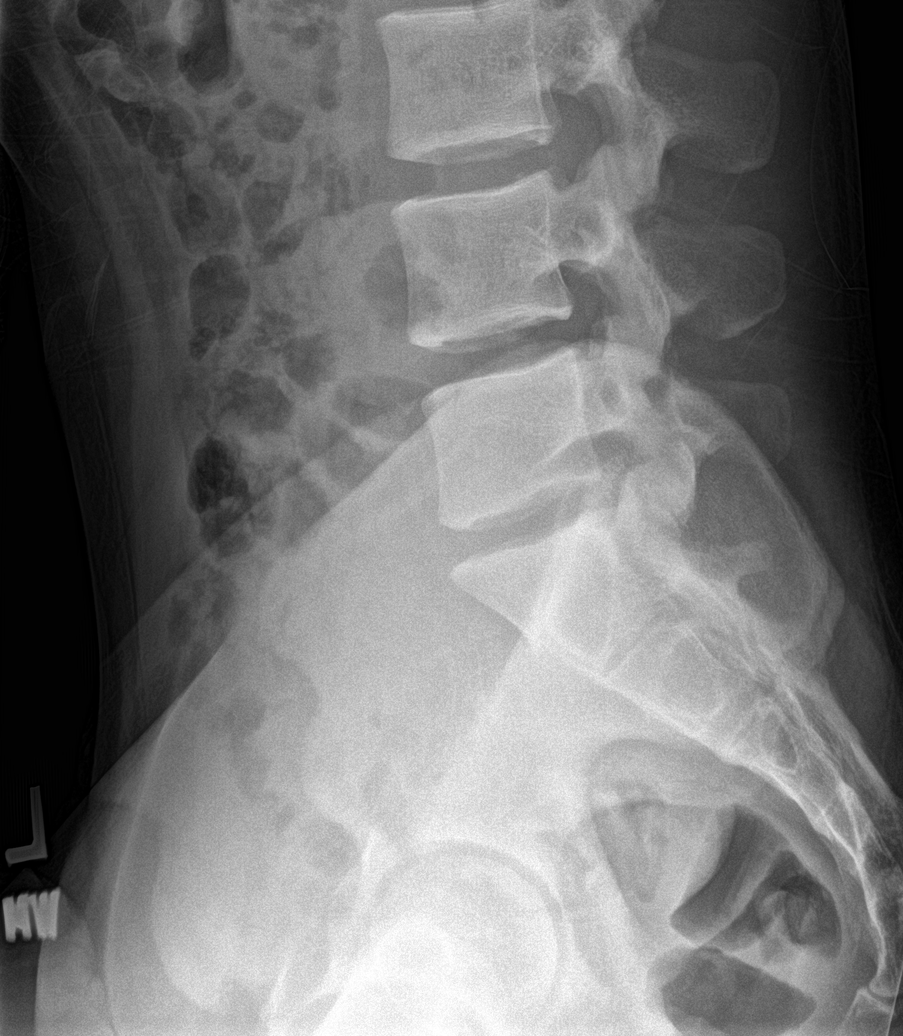

[l-spine obl (3 of 3)]
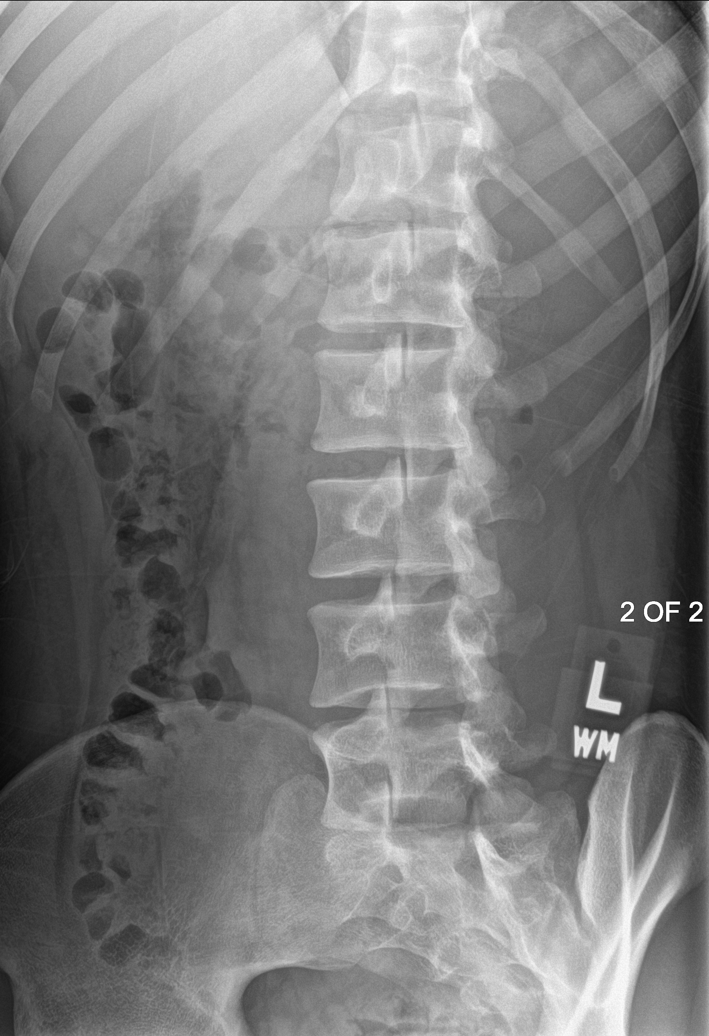

[6 of 6 positions shown; findings below may reference images not displayed]

FINDINGS: Five lumbar type vertebral bodies show normal alignment. No disc
space narrowing at L4-5 or above. Mild disc space narrowing at L5-S1
could indicate degenerative disc disease or be a normal variant. No
facet arthropathy. No pars defect. Sacroiliac joints appear normal.
No other focal finding.
IMPRESSION: Mild disc space narrowing L5-S1 which could indicate degenerative
disc disease or can be an normal variant. Otherwise normal study.

## 2018-09-15 ENCOUNTER — Ambulatory Visit (INDEPENDENT_AMBULATORY_CARE_PROVIDER_SITE_OTHER): Payer: BLUE CROSS/BLUE SHIELD

## 2018-09-15 ENCOUNTER — Ambulatory Visit
Admission: EM | Admit: 2018-09-15 | Discharge: 2018-09-15 | Disposition: A | Discharge status: Home / Self Care | Payer: BLUE CROSS/BLUE SHIELD | Attending: Family Medicine | Admitting: Family Medicine

## 2018-09-15 ENCOUNTER — Other Ambulatory Visit: Payer: Self-pay

## 2018-09-15 ENCOUNTER — Encounter: Payer: Self-pay | Admitting: Emergency Medicine

## 2018-09-15 DIAGNOSIS — R110 Nausea: Secondary | ICD-10-CM

## 2018-09-15 DIAGNOSIS — R509 Fever, unspecified: Secondary | ICD-10-CM | POA: Diagnosis not present

## 2018-09-15 DIAGNOSIS — J029 Acute pharyngitis, unspecified: Secondary | ICD-10-CM

## 2018-09-15 DIAGNOSIS — Z87891 Personal history of nicotine dependence: Secondary | ICD-10-CM

## 2018-09-15 DIAGNOSIS — J069 Acute upper respiratory infection, unspecified: Secondary | ICD-10-CM

## 2018-09-15 LAB — RAPID STREP SCREEN (MED CTR MEBANE ONLY): STREPTOCOCCUS, GROUP A SCREEN (DIRECT): NEGATIVE

## 2018-09-15 MED ORDER — LIDOCAINE VISCOUS HCL 2 % MT SOLN: OROMUCOSAL | 0 refills | Status: DC

## 2018-09-15 MED ORDER — ONDANSETRON 8 MG PO TBDP: 8.0000 mg | ORAL_TABLET | Freq: Three times a day (TID) | ORAL | 0 refills | Status: DC | PRN

## 2018-09-15 NOTE — Discharge Instructions (Signed)
Rest, fluids, over the counter tylenol/ibuprofen as needed °

## 2018-09-15 NOTE — ED Provider Notes (Signed)
[facility]    CSN: [id number] Arrival date & time: 09/15/18  1753     History   Chief Complaint Chief Complaint  Patient presents with  . Sore Throat  . Headache    HPI Anthony Wade is a 22 y.o. male.   The history is provided by the patient.  URI  Presenting symptoms: congestion, fever and sore throat   Severity:  Moderate Onset quality:  Sudden Duration:  4 days Timing:  Constant Progression:  Unchanged Chronicity:  New Relieved by:  None tried Ineffective treatments:  None tried Associated symptoms: headaches and neck pain   Associated symptoms: no wheezing   Associated symptoms comment:  Nausea Risk factors: sick contacts   Risk factors: not elderly, no chronic cardiac disease, no chronic kidney disease, no chronic respiratory disease, no diabetes mellitus, no immunosuppression, no recent illness and no recent travel     Past Medical History:  Diagnosis Date  . ADD (attention deficit disorder)     Patient Active Problem List   Diagnosis Date Noted  . ADHD (attention deficit hyperactivity disorder) 09/05/2016  . Addiction, marijuana (HCC) 07/29/2016  . Depression 07/15/2016  . Anxiety disorder 07/15/2016    Past Surgical History:  Procedure Laterality Date  . NO PAST SURGERIES         Home Medications    Prior to Admission medications   Medication Sig Start Date End Date Taking? Authorizing Provider  atomoxetine (STRATTERA) 40 MG capsule 1 capsule per day for 3 days, then increase to 2 capsules daily 09/05/16   Olevia Perches P, DO  lidocaine (XYLOCAINE) 2 % solution 20 ml gargle and spit q 6 hours prn 09/15/18   Payton Mccallum, MD  ondansetron (ZOFRAN ODT) 8 MG disintegrating tablet Take 1 tablet (8 mg total) by mouth every 8 (eight) hours as needed. 09/15/18   Payton Mccallum, MD    Family History Family History  Problem Relation Age of Onset  . Asthma Mother   . Mental illness Mother        anxiety, bipolar depression  .  Intestinal malrotation Mother   . ADD / ADHD Sister   . ADD / ADHD Brother   . Diabetes Maternal Grandmother   . Alcohol abuse Maternal Grandfather     Social History Social History   Tobacco Use  . Smoking status: Former Smoker    Packs/day: 0.25    Types: Cigarettes  . Smokeless tobacco: Never Used  Substance Use Topics  . Alcohol use: Yes    Comment: On occasion  . Drug use: Yes    Types: Marijuana     Allergies   Patient has no known allergies.   Review of Systems Review of Systems  Constitutional: Positive for fever.  HENT: Positive for congestion and sore throat.   Respiratory: Negative for wheezing.   Musculoskeletal: Positive for neck pain.  Neurological: Positive for headaches.     Physical Exam Triage Vital Signs ED Triage Vitals  Enc Vitals Group     BP 09/15/18 1806 132/80     Pulse Rate 09/15/18 1806 94     Resp 09/15/18 1806 17     Temp 09/15/18 1806 98.8 F (37.1 C)     Temp Source 09/15/18 1806 Oral     SpO2 09/15/18 1806 100 %     Weight 09/15/18 1805 170 lb (77.1 kg)     Height 09/15/18 1805 6\' 4"  (1.93 m)     Head Circumference --  Peak Flow --      Pain Score 09/15/18 1804 10     Pain Loc --      Pain Edu? --      Excl. in GC? --    No data found.  Updated Vital Signs BP 132/80 (BP Location: Left Arm)   Pulse 94   Temp 98.8 F (37.1 C) (Oral)   Resp 17   Ht 6\' 4"  (1.93 m)   Wt 77.1 kg   SpO2 100%   BMI 20.69 kg/m   Visual Acuity Right Eye Distance:   Left Eye Distance:   Bilateral Distance:    Right Eye Near:   Left Eye Near:    Bilateral Near:     Physical Exam  Constitutional: He is oriented to person, place, and time. He appears well-developed and well-nourished. No distress.  HENT:  Head: Normocephalic and atraumatic.  Right Ear: Tympanic membrane, external ear and ear canal normal.  Left Ear: Tympanic membrane, external ear and ear canal normal.  Nose: Rhinorrhea present.  Mouth/Throat: Uvula is  midline and mucous membranes are normal. Posterior oropharyngeal erythema present. No oropharyngeal exudate, posterior oropharyngeal edema or tonsillar abscesses. No tonsillar exudate.  Eyes: Conjunctivae are normal. Right eye exhibits no discharge. Left eye exhibits no discharge. No scleral icterus.  Neck: Normal range of motion. Neck supple. No tracheal deviation present. No thyromegaly present.  Cardiovascular: Normal rate, regular rhythm and normal heart sounds.  Pulmonary/Chest: Effort normal and breath sounds normal. No stridor. No respiratory distress. He has no wheezes. He has no rales. He exhibits no tenderness.  Lymphadenopathy:    He has no cervical adenopathy.  Neurological: He is alert and oriented to person, place, and time. No cranial nerve deficit.  Skin: Skin is warm and dry. No rash noted. He is not diaphoretic.  Nursing note and vitals reviewed.    UC Treatments / Results  Labs (all labs ordered are listed, but only abnormal results are displayed) Labs Reviewed  RAPID STREP SCREEN ([facility] ONLY)  CULTURE, GROUP A STREP [facility])    EKG None  Radiology Dg Chest 2 View  Result Date: 09/15/2018 CLINICAL DATA:  Fever.  Chills.  Vaping. EXAM: CHEST - 2 VIEW COMPARISON:  None. FINDINGS: Normal heart size. Normal mediastinal contour. No pneumothorax. No pleural effusion. Lungs appear clear, with no acute consolidative airspace disease and no pulmonary edema. IMPRESSION: No active cardiopulmonary disease. Electronically Signed   By: Delbert Phenix M.D.   On: 09/15/2018 19:09    Procedures Procedures (including critical care time)  Medications Ordered in UC Medications - No data to display  Initial Impression / Assessment and Plan / UC Course  I have reviewed the triage vital signs and the nursing notes.  Pertinent labs & imaging results that were available during my care of the patient were reviewed by me and considered in my medical decision making (see chart for  details).      Final Clinical Impressions(s) / UC Diagnoses   Final diagnoses:  Viral URI  Viral pharyngitis  Nausea without vomiting     Discharge Instructions     Rest, fluids, over the counter tylenol/ibuprofen as needed    ED Prescriptions    Medication Sig Dispense Auth. Provider   lidocaine (XYLOCAINE) 2 % solution 20 ml gargle and spit q 6 hours prn 100 mL Georgetta Crafton, MD   ondansetron (ZOFRAN ODT) 8 MG disintegrating tablet Take 1 tablet (8 mg total) by mouth every 8 (eight)  hours as needed. 6 tablet Payton Mccallum, MD     1. Labs/x-ray results and diagnosis reviewed with patient 2. rx as per orders above; reviewed possible side effects, interactions, risks and benefits  3. Recommend supportive treatment as above 4. Follow-up prn if symptoms worsen or don't improve   Controlled Substance Prescriptions [place] Controlled Substance Registry consulted? Not Applicable   Payton Mccallum, MD 09/15/18 2106

## 2018-09-15 NOTE — ED Triage Notes (Signed)
Pt c/o sore throat, headache, chills, vomiting , neck pain, and back pain. Started 4 days ago.

## 2018-09-17 LAB — CULTURE, GROUP A STREP (THRC)

## 2019-02-22 ENCOUNTER — Encounter: Payer: Self-pay | Admitting: Emergency Medicine

## 2019-02-22 ENCOUNTER — Other Ambulatory Visit: Payer: Self-pay

## 2019-02-22 ENCOUNTER — Ambulatory Visit
Admission: EM | Admit: 2019-02-22 | Discharge: 2019-02-22 | Disposition: A | Discharge status: Home / Self Care | Payer: Self-pay | Attending: Family Medicine | Admitting: Family Medicine

## 2019-02-22 ENCOUNTER — Ambulatory Visit (INDEPENDENT_AMBULATORY_CARE_PROVIDER_SITE_OTHER): Payer: Self-pay

## 2019-02-22 DIAGNOSIS — M25532 Pain in left wrist: Secondary | ICD-10-CM

## 2019-02-22 MED ORDER — MELOXICAM 15 MG PO TABS: 15.0000 mg | ORAL_TABLET | Freq: Every day | ORAL | 0 refills | Status: DC | PRN

## 2019-02-22 NOTE — Discharge Instructions (Signed)
Rest.  Ice.  Medication as needed.  Take care  Dr.Cameren Odwyer  

## 2019-02-22 NOTE — ED Triage Notes (Signed)
Patient in to c/o left wrist injury on 02/19/19. Patient was playing basketball and tried to dunk the ball and hit his left wrist against the rim.

## 2019-02-22 NOTE — ED Provider Notes (Signed)
[facility]    CSN: [id number] Arrival date & time: 02/22/19  1346  History   Chief Complaint Chief Complaint  Patient presents with  . Wrist Injury    left (DOI 02/19/19)   HPI  23 year old male presents with a left wrist injury.  Playing basketball on 3/14.  Patient states that he went to take the arm and hit his wrist on the rim.  Ports continued wrist pain.  Located on the palmar aspect in the midline.  He states that it is worse with pressure and squeezing his hand.  No medications were interventions tried.  No known relieving factors.  No reported swelling.  No hand pain.  No other associated symptoms. No other complaints.  PMH, Surgical Hx, Family Hx, Social History reviewed and updated as below.  Past Medical History:  Diagnosis Date  . ADD (attention deficit disorder)    Patient Active Problem List   Diagnosis Date Noted  . ADHD (attention deficit hyperactivity disorder) 09/05/2016  . Addiction, marijuana (HCC) 07/29/2016  . Depression 07/15/2016  . Anxiety disorder 07/15/2016   Past Surgical History:  Procedure Laterality Date  . NO PAST SURGERIES     Home Medications    Prior to Admission medications   Medication Sig Start Date End Date Taking? Authorizing Provider  meloxicam (MOBIC) 15 MG tablet Take 1 tablet (15 mg total) by mouth daily as needed for pain. 02/22/19   Tommie Sams, DO    Family History Family History  Problem Relation Age of Onset  . Asthma Mother   . Mental illness Mother        anxiety, bipolar depression  . Intestinal malrotation Mother   . Other Father        MVA  . ADD / ADHD Sister   . ADD / ADHD Brother   . Diabetes Maternal Grandmother   . Alcohol abuse Maternal Grandfather     Social History Social History   Tobacco Use  . Smoking status: Former Smoker    Packs/day: 0.25    Types: Cigarettes    Last attempt to quit: 02/21/2018    Years since quitting: 1.0  . Smokeless tobacco: Never Used  Substance Use  Topics  . Alcohol use: Yes    Comment: On occasion  . Drug use: Yes    Frequency: 2.0 times per week    Types: Marijuana     Allergies   Patient has no known allergies.   Review of Systems Review of Systems  Constitutional: Negative.   Musculoskeletal:       Left wrist pain/injury.   Physical Exam Triage Vital Signs ED Triage Vitals [02/22/19 1358]  Enc Vitals Group     BP 133/74     Pulse Rate 60     Resp 16     Temp 98.2 F (36.8 C)     Temp Source Oral     SpO2 99 %     Weight 170 lb (77.1 kg)     Height 6\' 4"  (1.93 m)     Head Circumference      Peak Flow      Pain Score 7     Pain Loc      Pain Edu?      Excl. in GC?    Updated Vital Signs BP 133/74 (BP Location: Right Arm)   Pulse 60   Temp 98.2 F (36.8 C) (Oral)   Resp 16   Ht 6\' 4"  (1.93 m)  Wt 77.1 kg   SpO2 99%   BMI 20.69 kg/m   Visual Acuity Right Eye Distance:   Left Eye Distance:   Bilateral Distance:    Right Eye Near:   Left Eye Near:    Bilateral Near:     Physical Exam Vitals signs and nursing note reviewed.  Constitutional:      General: He is not in acute distress.    Appearance: Normal appearance.  HENT:     Head: Normocephalic and atraumatic.  Eyes:     General: No scleral icterus.    Conjunctiva/sclera: Conjunctivae normal.  Pulmonary:     Effort: Pulmonary effort is normal. No respiratory distress.  Musculoskeletal:     Comments: Left wrist - No swelling. No discrete areas of tenderness on exam.  Skin:    General: Skin is warm.     Findings: No rash.  Neurological:     Mental Status: He is alert.  Psychiatric:        Mood and Affect: Mood normal.        Behavior: Behavior normal.    UC Treatments / Results  Labs (all labs ordered are listed, but only abnormal results are displayed) Labs Reviewed - No data to display  EKG None  Radiology Dg Wrist Complete Left  Result Date: 02/22/2019 CLINICAL DATA:  Patient hit wrist on basketball rim EXAM: LEFT  WRIST - COMPLETE 3+ VIEW COMPARISON:  None. FINDINGS: Frontal, oblique, lateral, and ulnar deviation scaphoid images were obtained. There is no appreciable fracture or dislocation. Joint spaces appear normal. No erosive change. IMPRESSION: No fracture or dislocation.  No evident arthropathy. Electronically Signed   By: Bretta Bang III M.D.   On: 02/22/2019 14:14    Procedures Procedures (including critical care time)  Medications Ordered in UC Medications - No data to display  Initial Impression / Assessment and Plan / UC Course  I have reviewed the triage vital signs and the nursing notes.  Pertinent labs & imaging results that were available during my care of the patient were reviewed by me and considered in my medical decision making (see chart for details).    23 year old male presents with left wrist injury.  Exam unremarkable.  X-ray negative.  Meloxicam as needed. Supportive care.  Final Clinical Impressions(s) / UC Diagnoses   Final diagnoses:  Left wrist pain     Discharge Instructions     Rest. Ice.  Medication as needed.  Take care  Dr. Adriana Simas    ED Prescriptions    Medication Sig Dispense Auth. Provider   meloxicam (MOBIC) 15 MG tablet Take 1 tablet (15 mg total) by mouth daily as needed for pain. 30 tablet Tommie Sams, DO     Controlled Substance Prescriptions [place] Controlled Substance Registry consulted? Not Applicable   Tommie Sams, DO 02/22/19 1453

## 2019-07-05 ENCOUNTER — Other Ambulatory Visit: Payer: Self-pay

## 2019-07-05 ENCOUNTER — Ambulatory Visit
Admission: EM | Admit: 2019-07-05 | Discharge: 2019-07-05 | Disposition: A | Discharge status: Home / Self Care | Payer: Self-pay | Attending: Emergency Medicine | Admitting: Emergency Medicine

## 2019-07-05 DIAGNOSIS — S0990XA Unspecified injury of head, initial encounter: Secondary | ICD-10-CM

## 2019-07-05 DIAGNOSIS — Y9367 Activity, basketball: Secondary | ICD-10-CM

## 2019-07-05 DIAGNOSIS — S0993XA Unspecified injury of face, initial encounter: Secondary | ICD-10-CM

## 2019-07-05 MED ORDER — AMOXICILLIN-POT CLAVULANATE 875-125 MG PO TABS: 1.0000 | ORAL_TABLET | Freq: Two times a day (BID) | ORAL | 0 refills | Status: DC

## 2019-07-05 MED ORDER — TETANUS-DIPHTH-ACELL PERTUSSIS 5-2.5-18.5 LF-MCG/0.5 IM SUSP: 0.5000 mL | Freq: Once | INTRAMUSCULAR | Status: DC

## 2019-07-05 NOTE — ED Provider Notes (Addendum)
[facility] ____________________________________________  Time seen: Approximately 11:23 AM  I have reviewed the triage vital signs and the nursing notes.   HISTORY  Chief Complaint Mouth Injury  HPI Anthony Wade is a 23 y.o. male presenting for evaluation of a mouth injury and head injury that occurred this past Friday evening.  Patient reports he has friends were playing basketball, and as he turned his head his friend lifted his head the same time.  Patient states he hit the left side of his mouth on the top of his friend's head.  States that he hit hard.  Did not fall to the ground.  No loss of consciousness.  States that he bit the inside of his lip during that time and he had to stop playing due to the bleeding.  Patient reports he has been rinsing mouth with salt water and it is been healing but some soreness since yesterday.  No drainage.  Patient again reports no loss of consciousness with his head injury.  However reports he has noticed intermittent "mild "headaches coming and going over the last few days.  States over-the-counter Tylenol has helped.  Denies vision changes, blurred vision, syncope, near syncope, nausea, vomiting, paresthesias, unilateral weakness.  Has continued to remain active.  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  Some decreased appetite, but overall continues eat and drink well.  Has remained active.  Unsure of last tetanus immunization.  No recent fevers, cough, congestion, sore throat, chest pain or shortness of breath.  Valerie Roys, DO: PCP   Past Medical History:  Diagnosis Date  . ADD (attention deficit disorder)     Patient Active Problem List   Diagnosis Date Noted  . ADHD (attention deficit hyperactivity disorder) 09/05/2016  . Addiction, marijuana ([facility]) 07/29/2016  . Depression 07/15/2016  . Anxiety disorder 07/15/2016    Past  Surgical History:  Procedure Laterality Date  . NO PAST SURGERIES        Current Facility-Administered Medications:  .  Tdap (BOOSTRIX) injection 0.5 mL, 0.5 mL, Intramuscular, Once, Marylene Land, NP  Current Outpatient Medications:  .  amoxicillin-clavulanate (AUGMENTIN) 875-125 MG tablet, Take 1 tablet by mouth every 12 (twelve) hours., Disp: 20 tablet, Rfl: 0  Allergies Patient has no known allergies.  Family History  Problem Relation Age of Onset  . Asthma Mother   . Mental illness Mother        anxiety, bipolar depression  . Intestinal malrotation Mother   . Other Father        MVA  . ADD / ADHD Sister   . ADD / ADHD Brother   . Diabetes Maternal Grandmother   . Alcohol abuse Maternal Grandfather     Social History Social History   Tobacco Use  . Smoking status: Former Smoker    Packs/day: 0.25    Types: Cigarettes    Quit date: 02/21/2018    Years since quitting: 1.3  . Smokeless tobacco: Never Used  Substance Use Topics  . Alcohol use: Yes    Comment: On occasion  . Drug use: Yes    Frequency: 2.0 times per week    Types: Marijuana    Review of Systems Constitutional: No fever ENT: No sore throat.  Positive oral injury. Cardiovascular: Denies chest pain. Respiratory: Denies shortness of breath. Gastrointestinal: No abdominal pain.   no vomiting.  Genitourinary: Negative for dysuria. Musculoskeletal:  Negative for back pain. Skin: Negative for rash. Neurological: Negative for focal weakness or numbness.     ____________________________________________   PHYSICAL EXAM:  VITAL SIGNS: ED Triage Vitals  Enc Vitals Group     BP 07/05/19 1116 110/69     Pulse Rate 07/05/19 1116 70     Resp 07/05/19 1116 16     Temp 07/05/19 1116 98.7 F (37.1 C)     Temp Source 07/05/19 1116 Oral     SpO2 07/05/19 1116 97 %     Weight 07/05/19 1114 190 lb (86.2 kg)     Height 07/05/19 1114 6\' 4"  (1.93 m)     Head Circumference --      Peak Flow --       Pain Score 07/05/19 1114 7     Pain Loc --      Pain Edu? --      Excl. in GC? --     Constitutional: Alert and oriented. Well appearing and in no acute distress. Eyes: Conjunctivae are normal. PERRL. EOMI. no pain with EOMs. ENT      Head: Normocephalic and atraumatic.      Nose: No congestion.       Mouth/Throat: Mucous membranes are moist.Oropharynx non-erythematous.  No dental injury noted.  Upper inner lip and lower lip area of less than 2 cm well approximated ulcerative area with mild erythema and mild tenderness, no gaping laceration.  No injury to external lip. Neck: No stridor. Supple without meningismus.  Hematological/Lymphatic/Immunilogical: No cervical lymphadenopathy. Cardiovascular: Normal rate, regular rhythm. Grossly normal heart sounds.  Good peripheral circulation. Respiratory: Normal respiratory effort without tachypnea nor retractions. Breath sounds are clear and equal bilaterally. No wheezes, rales, rhonchi. Musculoskeletal:  No midline cervical, thoracic or lumbar tenderness to palpation. Neurologic:  Normal speech and language. No gross focal neurologic deficits are appreciated. Speech is normal. No gait instability.  Negative Romberg.  Negative pronator drift.  No ataxia. 5/5 strength to bilateral upper and lower extremities.  Bilateral hand grip strong and equal. Skin:  Skin is warm, dry and intact. No rash noted. Psychiatric: Mood and affect are normal. Speech and behavior are normal. Patient exhibits appropriate insight and judgment   ___________________________________________   LABS (all labs ordered are listed, but only abnormal results are displayed)  Labs Reviewed - No data to display   PROCEDURES Procedures   INITIAL IMPRESSION / ASSESSMENT AND PLAN / ED COURSE  Pertinent labs & imaging results that were available during my care of the patient were reviewed by me and considered in my medical decision making (see chart for details).   Well-appearing patient.  No acute distress.  Injury while playing basketball.  Bit the inside of his lip causing injury.  Oral laceration is healing well, no indication for repair at this time, concern for onset of secondary infection.  Continues to eat and drink well.  Will treat with oral Augmentin.  No focal neurological deficit.  Minor head injury, discussed possible mild concussion.  Information given.  Instructed to rest, fluids, Tylenol as needed, limit screen time.  Information for concussion clinic given as well as recommend follow-up with primary care.  Work note given for today and tomorrow.Discussed indication, risks and benefits of medications with patient.  Tetanus immunization updated.  Discussed follow up with Primary care physician this week. Discussed follow up and return parameters including no resolution or any worsening concerns. Patient verbalized understanding and agreed to plan.   ____________________________________________   FINAL CLINICAL  IMPRESSION(S) / ED DIAGNOSES  Final diagnoses:  Minor head injury, initial encounter  Injury of mouth, initial encounter     ED Discharge Orders         Ordered    amoxicillin-clavulanate (AUGMENTIN) 875-125 MG tablet  Every 12 hours     07/05/19 1140           Note: This dictation was prepared with Dragon dictation along with smaller phrase technology. Any transcriptional errors that result from this process are unintentional.         Renford DillsMiller, Jia Mohamed, NP 07/05/19 1234

## 2019-07-05 NOTE — Discharge Instructions (Signed)
Take medication as prescribed. Rest. Drink plenty of fluids. Limit screen time.    Follow up with your primary care physician or the above for any continued complaints.    Return to Urgent care as needed.  Proceed directly to emergency room for any worsening complaints.

## 2019-07-05 NOTE — ED Triage Notes (Signed)
Patient states that he was playing basketball on Friday and hit his mouth on another persons head injuring the inside of his mouth. Patient complains of headaches and is concerned that he may have a concussion.

## 2019-12-28 ENCOUNTER — Ambulatory Visit
Admission: EM | Admit: 2019-12-28 | Discharge: 2019-12-28 | Disposition: A | Discharge status: Home / Self Care | Payer: Managed Care, Other (non HMO) | Attending: Family Medicine | Admitting: Family Medicine

## 2019-12-28 ENCOUNTER — Other Ambulatory Visit: Payer: Self-pay

## 2019-12-28 DIAGNOSIS — A630 Anogenital (venereal) warts: Secondary | ICD-10-CM

## 2019-12-28 DIAGNOSIS — K120 Recurrent oral aphthae: Secondary | ICD-10-CM

## 2019-12-28 LAB — HIV ANTIBODY (ROUTINE TESTING W REFLEX): HIV Screen 4th Generation wRfx: NONREACTIVE

## 2019-12-28 MED ORDER — IMIQUIMOD 3.75 % EX CREA: TOPICAL_CREAM | CUTANEOUS | 0 refills | Status: DC

## 2019-12-28 NOTE — ED Triage Notes (Signed)
Pt presents with possible genital warts he thinks may have been present for about a month along with possible mouth ulcers. Pt also states he has been having more frequent bowel movements and has noticed a change in the texture of his stool. He is concerns about possible STDs. He does state he has been having unprotected sexual encounters with multiple partners. He denies any urinary symptoms. Pt does state his appetite has also changed.

## 2019-12-28 NOTE — ED Provider Notes (Signed)
[facility]    CSN: [id number] Arrival date & time: 12/28/19  1025      History   Chief Complaint Chief Complaint  Patient presents with  . Genital Warts   HPI   24 year old male presents with 2 separate complaints.  Patient reports that he noticed a suspected genital wart on the shaft of his penis approximately 1.5 months ago.  He states that it is not causing him pain and is not particular bothersome.  However, it makes him concerned about the possibility of other STDs.  He desires STD testing today.  Additionally, patient states that he has not been feeling well recently.  He has been feeling fatigued.  He has recently developed 3 canker sores in his mouth which got him concerned.  He is concerned that he may have a "autoimmune condition".  No other associated symptoms.  No relieving factors.  No other complaints.  History reviewed and updated as below.  Patient Active Problem List   Diagnosis Date Noted  . ADHD (attention deficit hyperactivity disorder) 09/05/2016  . Addiction, marijuana (HCC) 07/29/2016  . Depression 07/15/2016  . Anxiety disorder 07/15/2016   Past Surgical History:  Procedure Laterality Date  . NO PAST SURGERIES     Home Medications    Prior to Admission medications   Medication Sig Start Date End Date Taking? Authorizing Provider  Imiquimod 3.75 % CREA Apply a thin layer once daily prior to bedtime; leave on skin for ~8 hours, then remove with mild soap and water. Continue for up to 8 weeks. 12/28/19   Tommie Sams, DO    Family History Family History  Problem Relation Age of Onset  . Asthma Mother   . Mental illness Mother        anxiety, bipolar depression  . Intestinal malrotation Mother   . Other Father        MVA  . ADD / ADHD Sister   . ADD / ADHD Brother   . Diabetes Maternal Grandmother   . Alcohol abuse Maternal Grandfather     Social History Social History   Tobacco Use  . Smoking status: Former Smoker   Packs/day: 0.25    Types: Cigarettes    Quit date: 02/21/2018    Years since quitting: 1.8  . Smokeless tobacco: Never Used  Substance Use Topics  . Alcohol use: Yes    Comment: On occasion  . Drug use: Yes    Frequency: 2.0 times per week    Types: Marijuana     Allergies   Patient has no known allergies.   Review of Systems Review of Systems  Constitutional: Negative.   HENT:       Oral lesions.  Genitourinary:       Genital warts.   Physical Exam Triage Vital Signs ED Triage Vitals  Enc Vitals Group     BP 12/28/19 0844 131/88     Pulse Rate 12/28/19 0844 74     Resp --      Temp 12/28/19 0844 98.4 F (36.9 C)     Temp Source 12/28/19 0844 Oral     SpO2 12/28/19 0844 99 %     Weight 12/28/19 0842 175 lb (79.4 kg)     Height 12/28/19 0842 6\' 4"  (1.93 m)     Head Circumference --      Peak Flow --      Pain Score 12/28/19 0932 0     Pain Loc --  Pain Edu? --      Excl. in [place]? --    Updated Vital Signs BP 131/88 (BP Location: Left Arm)   Pulse 74   Temp 98.4 F (36.9 C) (Oral)   Ht 6\' 4"  (1.93 m)   Wt 79.4 kg   SpO2 99%   BMI 21.30 kg/m   Visual Acuity Right Eye Distance:   Left Eye Distance:   Bilateral Distance:    Right Eye Near:   Left Eye Near:    Bilateral Near:     Physical Exam Constitutional:      General: He is not in acute distress.    Appearance: Normal appearance. He is not ill-appearing.  HENT:     Head: Normocephalic and atraumatic.     Mouth/Throat:     Comments: 3 separate canker sores noted. Eyes:     General:        Right eye: No discharge.        Left eye: No discharge.     Conjunctiva/sclera: Conjunctivae normal.  Cardiovascular:     Rate and Rhythm: Normal rate and regular rhythm.     Heart sounds: No murmur.  Pulmonary:     Effort: Pulmonary effort is normal.     Breath sounds: No wheezing, rhonchi or rales.  Genitourinary:    Comments: Small genital wart noted on the shaft of the penis. Neurological:      Mental Status: He is alert.  Psychiatric:        Mood and Affect: Mood normal.        Behavior: Behavior normal.    UC Treatments / Results  Labs (all labs ordered are listed, but only abnormal results are displayed) Labs Reviewed  GC/CHLAMYDIA PROBE AMP  RPR  HIV ANTIBODY (ROUTINE TESTING W REFLEX)    EKG   Radiology No results found.  Procedures Procedures (including critical care time)  Medications Ordered in UC Medications - No data to display  Initial Impression / Assessment and Plan / UC Course  I have reviewed the triage vital signs and the nursing notes.  Pertinent labs & imaging results that were available during my care of the patient were reviewed by me and considered in my medical decision making (see chart for details).    24 year old male presents with genital wart as well as canker sores.  Treating genital wart with topical imiquimod. Rx given for Debacterol (hand written) for canker sores.  Awaiting STD test results.  Final Clinical Impressions(s) / UC Diagnoses   Final diagnoses:  Genital warts  Canker sores oral     Discharge Instructions     Medication as prescribed.  Take care  Dr. Lacinda Axon    ED Prescriptions    Medication Sig Dispense Auth. Provider   Imiquimod 3.75 % CREA Apply a thin layer once daily prior to bedtime; leave on skin for ~8 hours, then remove with mild soap and water. Continue for up to 8 weeks. 7.5 g Coral Spikes, DO     PDMP not reviewed this encounter.   Thersa [place], [place] 12/28/19 [phone]

## 2019-12-28 NOTE — Discharge Instructions (Signed)
Medication as prescribed.  Take care  Dr. Editha Bridgeforth  

## 2019-12-29 LAB — RPR
RPR Ser Ql: REACTIVE — AB
RPR Titer: 1:1 {titer}

## 2019-12-30 LAB — GC/CHLAMYDIA PROBE AMP
Chlamydia trachomatis, NAA: NEGATIVE
Neisseria Gonorrhoeae by PCR: NEGATIVE

## 2019-12-30 LAB — T.PALLIDUM AB, TOTAL: T Pallidum Abs: NONREACTIVE

## 2020-01-23 ENCOUNTER — Telehealth: Payer: Managed Care, Other (non HMO) | Admitting: Family

## 2020-01-23 DIAGNOSIS — R0602 Shortness of breath: Secondary | ICD-10-CM

## 2020-01-23 DIAGNOSIS — R079 Chest pain, unspecified: Secondary | ICD-10-CM

## 2020-01-23 NOTE — Progress Notes (Signed)
Based on what you shared with me, I feel your condition warrants further evaluation and I recommend that you be seen for a face to face office visit.  Given your symptoms of chest pain and SOB you need to be seen face to face to be evaluated.    NOTE: If you entered your credit card information for this eVisit, you will not be charged. You may see a "hold" on your card for the $35 but that hold will drop off and you will not have a charge processed.   If you are having a true medical emergency please call 911.      For an urgent face to face visit, [facility] has five urgent care centers for your convenience:      NEW:  [facility] at [facility] Directions [phone] [address] [place], [place] [address] . 10 am - 6pm Monday - Friday    [facility]) Get Driving Directions [phone] [address] [place], [place] [address] . 10 am to 8 pm Monday-Friday . 12 pm to 8 pm [facility] Urgent Care at [facility] Get Driving Directions [phone] [address], [address] [place], [place] [address] . 8 am to 8 pm Monday-Friday . 9 am to 6 pm Saturday . 11 am to 6 pm Sunday     [facility] at [facility] Get Driving Directions  [phone] [address] [place], [place] [address] . 8 am to 8 pm Monday-Friday . 8 am to 4 pm [facility] Urgent Care at [facility] Directions [phone] [address] [place], [place] [address] . 12 pm to 6 pm Monday-Friday      Your e-visit answers were reviewed by a board certified advanced clinical practitioner to complete your personal care plan.  Thank you for using e-Visits.

## 2020-01-26 ENCOUNTER — Telehealth: Payer: Self-pay | Admitting: Family Medicine

## 2020-01-30 ENCOUNTER — Telehealth (INDEPENDENT_AMBULATORY_CARE_PROVIDER_SITE_OTHER): Payer: Managed Care, Other (non HMO) | Admitting: Nurse Practitioner

## 2020-01-30 ENCOUNTER — Ambulatory Visit: Payer: Managed Care, Other (non HMO) | Admitting: Nurse Practitioner

## 2020-01-30 ENCOUNTER — Encounter: Payer: Self-pay | Admitting: Nurse Practitioner

## 2020-01-30 ENCOUNTER — Other Ambulatory Visit: Payer: Self-pay

## 2020-01-30 ENCOUNTER — Ambulatory Visit: Payer: Managed Care, Other (non HMO) | Attending: Internal Medicine

## 2020-01-30 ENCOUNTER — Encounter: Payer: Self-pay | Admitting: Family Medicine

## 2020-01-30 DIAGNOSIS — F419 Anxiety disorder, unspecified: Secondary | ICD-10-CM | POA: Diagnosis not present

## 2020-01-30 DIAGNOSIS — G8929 Other chronic pain: Secondary | ICD-10-CM | POA: Diagnosis not present

## 2020-01-30 DIAGNOSIS — Z20822 Contact with and (suspected) exposure to covid-19: Secondary | ICD-10-CM

## 2020-01-30 DIAGNOSIS — M549 Dorsalgia, unspecified: Secondary | ICD-10-CM

## 2020-01-30 DIAGNOSIS — R198 Other specified symptoms and signs involving the digestive system and abdomen: Secondary | ICD-10-CM

## 2020-01-30 MED ORDER — SACCHAROMYCES BOULARDII 250 MG PO CAPS: 250.0000 mg | ORAL_CAPSULE | Freq: Two times a day (BID) | ORAL | 0 refills | Status: DC

## 2020-01-30 NOTE — Assessment & Plan Note (Signed)
Chronic, worsening lately.  Treat conservatively for now - back exercises sent to patient via mychart.  Will re-evaluate in 1 month at follow-up, may consider imaging.

## 2020-01-30 NOTE — Assessment & Plan Note (Signed)
Acute, ongoing.  Started around same time as abdominal pain - likely they are related.  Advised to keep food journal and completely stop or at least limit lactose x1 month.  Resources on lactose-free foods given.  Will start probiotic and f/u in 1 month.

## 2020-01-30 NOTE — Patient Instructions (Signed)
Back Exercises The following exercises strengthen the muscles that help to support the trunk and back. They also help to keep the lower back flexible. Doing these exercises can help to prevent back pain or lessen existing pain.  If you have back pain or discomfort, try doing these exercises 2-3 times each day or as told by your health care provider.  As your pain improves, do them once each day, but increase the number of times that you repeat the steps for each exercise (do more repetitions).  To prevent the recurrence of back pain, continue to do these exercises once each day or as told by your health care provider. Do exercises exactly as told by your health care provider and adjust them as directed. It is normal to feel mild stretching, pulling, tightness, or discomfort as you do these exercises, but you should stop right away if you feel sudden pain or your pain gets worse. Exercises Single knee to chest Repeat these steps 3-5 times for each leg: 1. Lie on your back on a firm bed or the floor with your legs extended. 2. Bring one knee to your chest. Your other leg should stay extended and in contact with the floor. 3. Hold your knee in place by grabbing your knee or thigh with both hands and hold. 4. Pull on your knee until you feel a gentle stretch in your lower back or buttocks. 5. Hold the stretch for 10-30 seconds. 6. Slowly release and straighten your leg. Pelvic tilt Repeat these steps 5-10 times: 1. Lie on your back on a firm bed or the floor with your legs extended. 2. Bend your knees so they are pointing toward the ceiling and your feet are flat on the floor. 3. Tighten your lower abdominal muscles to press your lower back against the floor. This motion will tilt your pelvis so your tailbone points up toward the ceiling instead of pointing to your feet or the floor. 4. With gentle tension and even breathing, hold this position for 5-10 seconds. Cat-cow Repeat these steps until  your lower back becomes more flexible: 1. Get into a hands-and-knees position on a firm surface. Keep your hands under your shoulders, and keep your knees under your hips. You may place padding under your knees for comfort. 2. Let your head hang down toward your chest. Contract your abdominal muscles and point your tailbone toward the floor so your lower back becomes rounded like the back of a cat. 3. Hold this position for 5 seconds. 4. Slowly lift your head, let your abdominal muscles relax and point your tailbone up toward the ceiling so your back forms a sagging arch like the back of a cow. 5. Hold this position for 5 seconds.  Press-ups Repeat these steps 5-10 times: 1. Lie on your abdomen (face-down) on the floor. 2. Place your palms near your head, about shoulder-width apart. 3. Keeping your back as relaxed as possible and keeping your hips on the floor, slowly straighten your arms to raise the top half of your body and lift your shoulders. Do not use your back muscles to raise your upper torso. You may adjust the placement of your hands to make yourself more comfortable. 4. Hold this position for 5 seconds while you keep your back relaxed. 5. Slowly return to lying flat on the floor.  Bridges Repeat these steps 10 times: 1. Lie on your back on a firm surface. 2. Bend your knees so they are pointing toward the ceiling and   your feet are flat on the floor. Your arms should be flat at your sides, next to your body. 3. Tighten your buttocks muscles and lift your buttocks off the floor until your waist is at almost the same height as your knees. You should feel the muscles working in your buttocks and the back of your thighs. If you do not feel these muscles, slide your feet 1-2 inches farther away from your buttocks. 4. Hold this position for 3-5 seconds. 5. Slowly lower your hips to the starting position, and allow your buttocks muscles to relax completely. If this exercise is too easy, try  doing it with your arms crossed over your chest. Abdominal crunches Repeat these steps 5-10 times: 1. Lie on your back on a firm bed or the floor with your legs extended. 2. Bend your knees so they are pointing toward the ceiling and your feet are flat on the floor. 3. Cross your arms over your chest. 4. Tip your chin slightly toward your chest without bending your neck. 5. Tighten your abdominal muscles and slowly raise your trunk (torso) high enough to lift your shoulder blades a tiny bit off the floor. Avoid raising your torso higher than that because it can put too much stress on your low back and does not help to strengthen your abdominal muscles. 6. Slowly return to your starting position. Back lifts Repeat these steps 5-10 times: 1. Lie on your abdomen (face-down) with your arms at your sides, and rest your forehead on the floor. 2. Tighten the muscles in your legs and your buttocks. 3. Slowly lift your chest off the floor while you keep your hips pressed to the floor. Keep the back of your head in line with the curve in your back. Your eyes should be looking at the floor. 4. Hold this position for 3-5 seconds. 5. Slowly return to your starting position. Contact a health care provider if:  Your back pain or discomfort gets much worse when you do an exercise.  Your worsening back pain or discomfort does not lessen within 2 hours after you exercise. If you have any of these problems, stop doing these exercises right away. Do not do them again unless your health care provider says that you can. Get help right away if:  You develop sudden, severe back pain. If this happens, stop doing the exercises right away. Do not do them again unless your health care provider says that you can. This information is not intended to replace advice given to you by your health care provider. Make sure you discuss any questions you have with your health care provider. Document Revised: 03/31/2019 Document  Reviewed: 08/26/2018 Elsevier Patient Education  [place].  Lactose-Free Diet, Adult If you have lactose intolerance, you are not able to digest lactose. Lactose is a natural sugar found mainly in dairy milk and dairy products. You may need to avoid all foods and beverages that contain lactose. A lactose-free diet can help you do this. Which foods have lactose? Lactose is found in dairy milk and dairy products, such as:  Yogurt.  Cheese.  Butter.  Margarine.  Sour cream.  Cream.  Whipped toppings and nondairy creamers.  Ice cream and other dairy-based desserts. Lactose is also found in foods or products made with dairy milk or milk ingredients. To find out whether a food contains dairy milk or a milk ingredient, look at the ingredients list. Avoid foods with the statement "May contain milk" and foods that contain:  Milk powder.  Whey.  Curd.  Caseinate.  Lactose.  Lactalbumin.  Lactoglobulin. What are alternatives to dairy milk and foods made with milk products?  Lactose-free milk.  Soy milk with added calcium and vitamin D.  Almond milk, coconut milk, rice milk, or other nondairy milk alternatives with added calcium and vitamin D. Note that these are low in protein.  Soy products, such as soy yogurt, soy cheese, soy ice cream, and soy-based sour cream.  Other nut milk products, such as almond yogurt, almond cheese, cashew yogurt, cashew cheese, cashew ice cream, coconut yogurt, and coconut ice cream. What are tips for following this plan?  Do not consume foods, beverages, vitamins, minerals, or medicines containing lactose. Read ingredient lists carefully.  Look for the words "lactose-free" on labels.  Use lactase enzyme drops or tablets as directed by your health care provider.  Use lactose-free milk or a milk alternative, such as soy milk or almond milk, for drinking and cooking.  Make sure you get enough calcium and vitamin D in your diet. A  lactose-free eating plan can be lacking in these important nutrients.  Take calcium and vitamin D supplements as directed by your health care provider. Talk to your health care provider about supplements if you are not able to get enough calcium and vitamin D from food. What foods can I eat?  Fruits All fresh, canned, frozen, or dried fruits that are not processed with lactose. Vegetables All fresh, frozen, and canned vegetables without cheese, cream, or butter sauces. Grains Any that are not made with dairy milk or dairy products. Meats and other proteins Any meat, fish, poultry, and other protein sources that are not made with dairy milk or dairy products. Soy cheese and yogurt. Fats and oils Any that are not made with dairy milk or dairy products. Beverages Lactose-free milk. Soy, rice, or almond milk with added calcium and vitamin D. Fruit and vegetable juices. Sweets and desserts Any that are not made with dairy milk or dairy products. Seasonings and condiments Any that are not made with dairy milk or dairy products. Calcium Calcium is found in many foods that contain lactose and is important for bone health. The amount of calcium you need depends on your age:  Adults younger than 50 years: 1,000 mg of calcium a day.  Adults older than 50 years: 1,200 mg of calcium a day. If you are not getting enough calcium, you may get it from other sources, including:  Orange juice with calcium added. There are 300-350 mg of calcium in 1 cup of orange juice.  Calcium-fortified soy milk. There are 300-400 mg of calcium in 1 cup of calcium-fortified soy milk.  Calcium-fortified rice or almond milk. There are 300 mg of calcium in 1 cup of calcium-fortified rice or almond milk.  Calcium-fortified breakfast cereals. There are 100-1,000 mg of calcium in calcium-fortified breakfast cereals.  Spinach, cooked. There are 145 mg of calcium in  cup of cooked spinach.  Edamame, cooked. There are  130 mg of calcium in  cup of cooked edamame.  Collard greens, cooked. There are 125 mg of calcium in  cup of cooked collard greens.  Kale, frozen or cooked. There are 90 mg of calcium in  cup of cooked or frozen kale.  Almonds. There are 95 mg of calcium in  cup of almonds.  Broccoli, cooked. There are 60 mg of calcium in 1 cup of cooked broccoli. The items listed above may not be a complete list of  recommended foods and beverages. Contact a dietitian for more options. What foods are not recommended? Fruits None, unless they are made with dairy milk or dairy products. Vegetables None, unless they are made with dairy milk or dairy products. Grains Any grains that are made with dairy milk or dairy products. Meats and other proteins None, unless they are made with dairy milk or dairy products. Dairy All dairy products, including milk, goat's milk, buttermilk, kefir, acidophilus milk, flavored milk, evaporated milk, condensed milk, dulce de [place], eggnog, yogurt, cheese, and cheese spreads. Fats and oils Any that are made with milk or milk products. Margarines and salad dressings that contain milk or cheese. Cream. Half and half. Cream cheese. Sour cream. Chip dips made with sour cream or yogurt. Beverages Hot chocolate. Cocoa with lactose. Instant iced teas. Powdered fruit drinks. Smoothies made with dairy milk or yogurt. Sweets and desserts Any that are made with milk or milk products. Seasonings and condiments Chewing gum that has lactose. Spice blends if they contain lactose. Artificial sweeteners that contain lactose. Nondairy creamers. The items listed above may not be a complete list of foods and beverages to avoid. Contact a dietitian for more information. Summary  If you are lactose intolerant, it means that you have a hard time digesting lactose, a natural sugar found in milk and milk products.  Following a lactose-free diet can help you manage this condition.  Calcium  is important for bone health and is found in many foods that contain lactose. Talk with your health care provider about other sources of calcium. This information is not intended to replace advice given to you by your health care provider. Make sure you discuss any questions you have with your health care provider. Document Revised: 12/22/2017 Document Reviewed: 12/22/2017 Elsevier Patient Education  2020 Elsevier Inc. Lactose-Controlled Eating Plan, Adult Lactose intolerance is when the body is not able to digest lactose, a natural sugar that is found in milk and milk products. If you are lactose intolerant, avoiding foods and drinks that contain lactose may help ease digestive problems such as diarrhea or stomach pain. What are tips for following this plan? Reading food labels  Check food ingredient lists carefully. Avoid foods made with butter, cream, milk, milk solids, milk powder, curd, caseinate, or whey.  Avoid products with the note "may contain milk."  Look for the words "lactose-free" or "lactose-reduced" on food labels. You can eat lactose-free foods, and you may be able to eat small amounts of lactose-reduced foods. Shopping  Look for nondairy substitutes, such as: ? Nondairy creamer. ? Almond or soy milk. ? Soy or coconut yogurt. ? Dairy-free cheese.  Buy lactose-free cow milk. Cooking  Avoid cooking with butter. Use vegetable, nut, and seed oils instead.  Prepare soups without cream. Use other products to thicken soups, such as corn starch or tomato paste. Meal planning  Avoid eating foods that contain lactose.  Some people with lactose intolerance can eat foods that contain small amounts of lactose. Foods that contain less than 1 gram of lactose per serving include: ? 1-2 oz of aged cheese, such as Parmesan, Swiss, or cheddar. ? 2 Tbsp of cream cheese. ? ? cup of cottage cheese. ?  cup of ricotta cheese.  Some people are able to tolerate cultured dairy products,  such as yogurt, buttermilk, and kefir. The healthy bacteria in these products helps you digest lactose.  If you decide to try a food that contains lactose: ? Eat only one food with lactose  in it at a time. ? Eat only a small amount of the food. ? Stop eating the food if your symptoms return. Getting enough calcium  Milk and milk products contain a lot of calcium, which is an important nutrient for your health. When you avoid milk and milk products, make sure to get calcium from other foods.  Talk with your health care provider about how much calcium you need each day. The amount of calcium you need each day depends on your age and overall health.  Nondairy foods that are high in calcium include: ? Sardines and canned salmon. ? Dried beans. ? Almonds. ? Turnip greens, collards, kale, and broccoli. ? Calcium-fortified soy milk and tofu. ? Calcium-fortified orange juice.  Talk with your health care provider about vitamin and mineral supplements. Take supplements only as directed. Summary  Avoiding foods and drinks that contain lactose may help ease digestive problems such as diarrhea or stomach pain.  When you avoid milk and milk products, make sure to get calcium from other foods.  Take vitamin and mineral supplements only as directed by your health care provider. This information is not intended to replace advice given to you by your health care provider. Make sure you discuss any questions you have with your health care provider. Document Revised: 11/06/2017 Document Reviewed: 03/06/2017 Elsevier Patient Education  2020 [employer].

## 2020-01-30 NOTE — Progress Notes (Signed)
Called pt to schedule f/u, no answer, left vm, sent MyChart message, sending letter.

## 2020-01-30 NOTE — Progress Notes (Signed)
There were no vitals taken for this visit.   Subjective:    Patient ID:  Anthony Wade, male    DOB: Apr 13, 1996, 24 y.o.   MRN: [id number]  HPI: Anthony Wade is a 24 y.o. male  Chief Complaint  Patient presents with  . Abdominal Pain    under rib cage, alternate sides for about a month   ABDOMINAL PAIN  Duration:months Onset: both sudden and gradual Severity: ranges from mild to severe Quality: throbbing Location:  LUQ and RUQ  Episode duration: hours, notices more at night Radiation: no Frequency: daily Alleviating factors: nothing Aggravating factors: laying Status: stable Treatments attempted: none Fever: no Nausea: yes Vomiting: no Weight loss: no Decreased appetite: yes  Abnormal BM: some variation for about 1 month - between soft stools and very hard stools, some with mucus Diarrhea: yes Constipation: yes Blood in stool: no Heartburn: no Jaundice: no Rash: no Dysuria/urinary frequency: no Hematuria: no History of sexually transmitted disease: no Recurrent NSAID use: no   The patient is also worried about his chronic back pain that he has had for years.  He feels it has been worsening the past couple of months and just learned that his mother's father had scoliosis.  He would like to have an x-ray of his back.  Also concerned about teeth-grinding.  Reports his anxiety/stress level has been increased recently and feels he is having paranoia related to his health due to the above complaints.    Had a COVID-19 exposure in the past week, so the appointment was changed to a virtual visit.  No Known Allergies  Outpatient Encounter Medications as of 01/30/2020  Medication Sig  . Imiquimod 3.75 % CREA Apply a thin layer once daily prior to bedtime; leave on skin for ~8 hours, then remove with mild soap and water. Continue for up to 8 weeks.  . saccharomyces boulardii (FLORASTOR) 250 MG capsule Take 1 capsule (250 mg total) by mouth 2 (two) times daily.   No  facility-administered encounter medications on file as of 01/30/2020.   Patient Active Problem List   Diagnosis Date Noted  . Change in bowel movement 01/30/2020  . Chronic back pain 01/30/2020  . ADHD (attention deficit hyperactivity disorder) 09/05/2016  . Addiction, marijuana ([place]) 07/29/2016  . Depression 07/15/2016  . Anxiety disorder 07/15/2016   Past Medical History:  Diagnosis Date  . ADD (attention deficit disorder)    Review of Systems  Constitutional: Positive for appetite change. Negative for fever and unexpected weight change.  Gastrointestinal: Positive for abdominal pain, constipation, diarrhea and nausea. Negative for abdominal distention, blood in stool and vomiting.  Genitourinary: Negative.  Negative for dysuria, frequency and hematuria.  Musculoskeletal: Positive for back pain.  Skin: Negative.  Negative for color change, rash and wound.  Neurological: Negative.   Psychiatric/Behavioral: Positive for sleep disturbance. Negative for agitation, confusion and decreased concentration. The patient is nervous/anxious.    Per HPI unless specifically indicated above     Objective:    There were no vitals taken for this visit.  Wt Readings from Last 3 Encounters:  12/28/19 175 lb (79.4 kg)  07/05/19 190 lb (86.2 kg)  02/22/19 170 lb (77.1 kg)    Physical Exam Nursing note reviewed.  Constitutional:      General: He is not in acute distress.    Appearance: He is well-developed. He is not toxic-appearing.  HENT:     Head: Normocephalic.  Eyes:     General: No scleral icterus.  Extraocular Movements: Extraocular movements intact.  Pulmonary:     Effort: Pulmonary effort is normal. No respiratory distress.  Abdominal:     Comments: Unable to assess  Skin:    Coloration: Skin is not cyanotic, jaundiced or pale.  Neurological:     General: No focal deficit present.     Mental Status: He is alert and oriented to person, place, and time.     Motor: No  weakness.  Psychiatric:        Mood and Affect: Mood normal. Mood is not anxious or depressed.        Behavior: Behavior normal.       Assessment & Plan:   Problem List Items Addressed This Visit      Other   Anxiety disorder    Chronic, ongoing.  Not taking medication or talking with Counselor currently and declines for now.  Will re-evaluate at 4 week f/u if medication/counseling is desired at that time.      Change in bowel movement - Primary    Acute, ongoing.  Started around same time as abdominal pain - likely they are related.  Advised to keep food journal and completely stop or at least limit lactose x1 month.  Resources on lactose-free foods given.  Will start probiotic and f/u in 1 month.      Relevant Medications   saccharomyces boulardii (FLORASTOR) 250 MG capsule   Chronic back pain    Chronic, worsening lately.  Treat conservatively for now - back exercises sent to patient via mychart.  Will re-evaluate in 1 month at follow-up, may consider imaging.       Other Visit Diagnoses    Exposure to COVID-19 virus       Reports that he is getting tested today.  No sxs.  Advised to go to ED with sudden onset chest pain or SOB.       Follow up plan: Return in about 4 weeks (around 02/27/2020) for f/u abdominal pain, mood, back pain.  Due to the catastrophic nature of the COVID-19 pandemic, this visit was done through audio contact only. This visit was completed via Mychart due to the restrictions of the COVID-19 pandemic. All issues as above were discussed and addressed. Physical exam was done as below through visual confirmation on Mychart. If it was felt that the patient should be evaluated in the office, they were directed there. The patient verbally consented to this visit. . Location of the patient: parking lot . Location of the provider: work . Those involved with this call:  . Provider: Mardene Celeste, DNP . CMA: Tiffany Reel, CMA . Front Desk/Registration: Beverely Pace  . Time spent on call: 21 minutes on the phone discussing health concerns. 30 minutes total spent in review of patient's record and preparation of their chart.  . I verified patient identity using two factors (patient name and date of birth). Patient consents verbally to being seen via telemedicine visit today.

## 2020-01-30 NOTE — Assessment & Plan Note (Signed)
Chronic, ongoing.  Not taking medication or talking with Counselor currently and declines for now.  Will re-evaluate at 4 week f/u if medication/counseling is desired at that time.

## 2020-01-31 ENCOUNTER — Encounter: Payer: Self-pay | Admitting: Family Medicine

## 2020-01-31 LAB — NOVEL CORONAVIRUS, NAA: SARS-CoV-2, NAA: NOT DETECTED

## 2020-02-13 ENCOUNTER — Telehealth: Payer: Self-pay | Admitting: Family Medicine

## 2020-02-13 ENCOUNTER — Encounter: Payer: Self-pay | Admitting: Family Medicine

## 2020-02-13 ENCOUNTER — Ambulatory Visit: Payer: Managed Care, Other (non HMO) | Admitting: Family Medicine

## 2020-02-13 NOTE — Telephone Encounter (Signed)
Called pt no answer, letter sent through [place] and mail.

## 2020-02-13 NOTE — Telephone Encounter (Signed)
-----   Message from Wells Guiles, NP sent at 01/30/2020 11:11 AM EST ----- Please call patient to schedule a 4 week follow up.  Thank you.

## 2020-03-29 ENCOUNTER — Other Ambulatory Visit: Payer: Self-pay

## 2020-03-29 ENCOUNTER — Encounter: Payer: Self-pay | Admitting: Nurse Practitioner

## 2020-03-29 ENCOUNTER — Ambulatory Visit (INDEPENDENT_AMBULATORY_CARE_PROVIDER_SITE_OTHER): Payer: Managed Care, Other (non HMO) | Admitting: Nurse Practitioner

## 2020-03-29 VITALS — BP 109/69 | HR 76 | Temp 97.7°F | Ht 74.0 in | Wt 186.4 lb

## 2020-03-29 DIAGNOSIS — F329 Major depressive disorder, single episode, unspecified: Secondary | ICD-10-CM

## 2020-03-29 DIAGNOSIS — F419 Anxiety disorder, unspecified: Secondary | ICD-10-CM | POA: Diagnosis not present

## 2020-03-29 MED ORDER — VENLAFAXINE HCL 75 MG PO TABS: 75.0000 mg | ORAL_TABLET | Freq: Every day | ORAL | 0 refills | Status: AC

## 2020-03-29 NOTE — Assessment & Plan Note (Signed)
Chronic, ongoing.  Declines therapy for now but willing to start medication, although weary of side effects.  Advised to ramp up medication with 1/2 pill for four days and then start full dose.  With any new symptoms or SI/HI, stop medication and go to ED.  Follow up in 4 weeks on mood.

## 2020-03-29 NOTE — Patient Instructions (Signed)
Nice seeing you again, Tyress.  See you in about 4 weeks, let me know if you need anything in the meantime. - Tadarrius Burch  Venlafaxine tablets What is this medicine? VENLAFAXINE (VEN la fax een) is used to treat depression, anxiety and panic disorder. This medicine may be used for other purposes; ask your health care provider or pharmacist if you have questions. COMMON BRAND NAME(S): Effexor What should I tell my health care provider before I take this medicine? They need to know if you have any of these conditions:  bleeding disorders  glaucoma  heart disease  high blood pressure  high cholesterol  kidney disease  liver disease  low levels of sodium in the blood  mania or bipolar disorder  seizures  suicidal thoughts, plans, or attempt; a previous suicide attempt by you or a family  take medicines that treat or prevent blood clots  thyroid disease  an unusual or allergic reaction to venlafaxine, desvenlafaxine, other medicines, foods, dyes, or preservatives  pregnant or trying to get pregnant  breast-feeding How should I use this medicine? Take this medicine by mouth with a glass of water. Follow the directions on the prescription label. Take it with food. Take your medicine at regular intervals. Do not take your medicine more often than directed. Do not stop taking this medicine suddenly except upon the advice of your doctor. Stopping this medicine too quickly may cause serious side effects or your condition may worsen. A special MedGuide will be given to you by the pharmacist with each prescription and refill. Be sure to read this information carefully each time. Talk to your pediatrician regarding the use of this medicine in children. Special care may be needed. Overdosage: If you think you have taken too much of this medicine contact a poison control center or emergency room at once. NOTE: This medicine is only for you. Do not share this medicine with others. What if I  miss a dose? If you miss a dose, take it as soon as you can. If it is almost time for your next dose, take only that dose. Do not take double or extra doses. What may interact with this medicine? Do not take this medicine with any of the following medications:  certain medicines for fungal infections like fluconazole, itraconazole, ketoconazole, posaconazole, voriconazole  cisapride  desvenlafaxine  dronedarone  duloxetine  levomilnacipran  linezolid  MAOIs like Carbex, Eldepryl, Marplan, Nardil, and Parnate  methylene blue (injected into a vein)  milnacipran  pimozide  thioridazine This medicine may also interact with the following medications:  amphetamines  aspirin and aspirin-like medicines  certain medicines for depression, anxiety, or psychotic disturbances  certain medicines for migraine headaches like almotriptan, eletriptan, frovatriptan, naratriptan, rizatriptan, sumatriptan, zolmitriptan  certain medicines for sleep  certain medicines that treat or prevent blood clots like dalteparin, enoxaparin, warfarin  cimetidine  clozapine  diuretics  fentanyl  furazolidone  indinavir  isoniazid  lithium  metoprolol  NSAIDS, medicines for pain and inflammation, like ibuprofen or naproxen  other medicines that prolong the QT interval (cause an abnormal heart rhythm) like dofetilide, ziprasidone  procarbazine  rasagiline  supplements like St. John's wort, kava kava, valerian  tramadol  tryptophan This list may not describe all possible interactions. Give your health care provider a list of all the medicines, herbs, non-prescription drugs, or dietary supplements you use. Also tell them if you smoke, drink alcohol, or use illegal drugs. Some items may interact with your medicine. What should I watch for  while using this medicine? Tell your doctor if your symptoms do not get better or if they get worse. Visit your doctor or health care professional  for regular checks on your progress. Because it may take several weeks to see the full effects of this medicine, it is important to continue your treatment as prescribed by your doctor. Patients and their families should watch out for new or worsening thoughts of suicide or depression. Also watch out for sudden changes in feelings such as feeling anxious, agitated, panicky, irritable, hostile, aggressive, impulsive, severely restless, overly excited and hyperactive, or not being able to sleep. If this happens, especially at the beginning of treatment or after a change in dose, call your health care professional. This medicine can cause an increase in blood pressure. Check with your doctor for instructions on monitoring your blood pressure while taking this medicine. You may get drowsy or dizzy. Do not drive, use machinery, or do anything that needs mental alertness until you know how this medicine affects you. Do not stand or sit up quickly, especially if you are an older patient. This reduces the risk of dizzy or fainting spells. Alcohol may interfere with the effect of this medicine. Avoid alcoholic drinks. Your mouth may get dry. Chewing sugarless gum, sucking hard candy and drinking plenty of water will help. Contact your doctor if the problem does not go away or is severe. What side effects may I notice from receiving this medicine? Side effects that you should report to your doctor or health care professional as soon as possible:  allergic reactions like skin rash, itching or hives, swelling of the face, lips, or tongue  anxious  breathing problems  confusion  changes in vision  chest pain  confusion  elevated mood, decreased need for sleep, racing thoughts, impulsive behavior  eye pain  fast, irregular heartbeat  feeling faint or lightheaded, falls  feeling agitated, angry, or irritable  hallucination, loss of contact with reality  high blood pressure  loss of balance or  coordination  palpitations  redness, blistering, peeling or loosening of the skin, including inside the mouth  restlessness, pacing, inability to keep still  seizures  stiff muscles  suicidal thoughts or other mood changes  trouble passing urine or change in the amount of urine  trouble sleeping  unusual bleeding or bruising  unusually weak or tired  vomiting Side effects that usually do not require medical attention (report to your doctor or health care professional if they continue or are bothersome):  change in sex drive or performance  change in appetite or weight  constipation  dizziness  dry mouth  headache  increased sweating  nausea  tired This list may not describe all possible side effects. Call your doctor for medical advice about side effects. You may report side effects to FDA at 1-800-FDA-1088. Where should I keep my medicine? Keep out of the reach of children. Store at a controlled temperature between 20 and 25 degrees C (68 and 77 degrees F), in a dry place. Throw away any unused medicine after the expiration date. NOTE: This sheet is a summary. It may not cover all possible information. If you have questions about this medicine, talk to your doctor, pharmacist, or health care provider.  2020 Elsevier/Gold Standard (2018-11-16 12:08:23)

## 2020-03-29 NOTE — Progress Notes (Signed)
BP 109/69   Pulse 76   Temp 97.7 F (36.5 C) (Oral)   Ht 6\' 2"  (1.88 m)   Wt 186 lb 6.4 oz (84.6 kg)   SpO2 97%   BMI 23.93 kg/m    Subjective:    Patient ID: Anthony Wade, male    DOB: 12/03/1996, 24 y.o.   MRN: [id number]  HPI: Anthony Wade is a 24 y.o. male presenting for follow up of mood.  Chief Complaint  Patient presents with  . Anxiety   ANXIETY/STRESS Duration:uncontrolled Anxious mood: yes  Excessive worrying: sometimes Irritability: yes  Sweating: no Nausea: no Palpitations:no Hyperventilation: no Panic attacks: no Agoraphobia: yes  Obscessions/compulsions: no Depressed mood: yes Depression screen [facility] 2/9 03/29/2020 09/05/2016 07/29/2016 07/15/2016  Decreased Interest 2 1 3 1   Down, Depressed, Hopeless 2 1 3 2   PHQ - 2 Score 4 2 6 3   Altered sleeping 3 - 3 3  Tired, decreased energy 3 - 3 2  Change in appetite 2 - 2 2  Feeling bad or failure about yourself  2 - 2 1  Trouble concentrating 1 - 2 0  Moving slowly or fidgety/restless 1 - 2 1  Suicidal thoughts 1 - 0 1  PHQ-9 Score 17 - 20 13  Difficult doing work/chores Very difficult - - -   GAD 7 : Generalized Anxiety Score 03/29/2020 09/05/2016 07/29/2016 07/15/2016  Nervous, Anxious, on Edge 2 1 2 2   Control/stop worrying 1 1 1 2   Worry too much - different things 1 0 1 2  Trouble relaxing 0 0 1 3  Restless 0 0 1 2  Easily annoyed or irritable 0 2 2 3   Afraid - awful might happen 0 1 2 0  Total GAD 7 Score 4 5 10 14   Anxiety Difficulty - Not difficult at all Somewhat difficult Somewhat difficult   Anhedonia: no Weight changes: no Insomnia: no hard to fall asleep AND stay asleep  Hypersomnia: yes Fatigue/loss of energy: yes Feelings of worthlessness: yes Feelings of guilt: yes Impaired concentration/indecisiveness: no Suicidal ideations: no  Homicidal ideations: no  Crying spells: yes Recent Stressors/Life Changes: yes   Relationship problems: yes   Family stress: yes     Financial  stress: yes    Job stress: yes    Recent death/loss: yes; grandfather passed two and passed   No Known Allergies  Outpatient Encounter Medications as of 03/29/2020  Medication Sig  . Imiquimod 3.75 % CREA Apply a thin layer once daily prior to bedtime; leave on skin for ~8 hours, then remove with mild soap and water. Continue for up to 8 weeks. (Patient not taking: Reported on 03/29/2020)  . saccharomyces boulardii (FLORASTOR) 250 MG capsule Take 1 capsule (250 mg total) by mouth 2 (two) times daily. (Patient not taking: Reported on 03/29/2020)  . venlafaxine (EFFEXOR) 75 MG tablet Take 1 tablet (75 mg total) by mouth daily. Start with 1/2 pill for 4 days (37.5 mg) and then take whole pill for rest of bottle.   No facility-administered encounter medications on file as of 03/29/2020.   Patient Active Problem List   Diagnosis Date Noted  . Change in bowel movement 01/30/2020  . Chronic back pain 01/30/2020  . ADHD (attention deficit hyperactivity disorder) 09/05/2016  . Addiction, marijuana (HCC) 07/29/2016  . Anxiety and depression 07/15/2016   Past Medical History:  Diagnosis Date  . ADD (attention deficit disorder)    Relevant past medical, surgical, family and social history reviewed and updated  as indicated. Interim medical history since our last visit reviewed.  Review of Systems  Constitutional: Positive for fatigue. Negative for fever and unexpected weight change.  Eyes: Negative.  Negative for visual disturbance.  Respiratory: Negative.  Negative for shortness of breath and wheezing.   Cardiovascular: Negative.  Negative for chest pain and palpitations.  Gastrointestinal: Negative.  Negative for constipation, diarrhea, nausea and vomiting.  Endocrine: Negative.   Musculoskeletal: Positive for back pain (chronic per patient).  Neurological: Negative.  Negative for dizziness, weakness and headaches.  Psychiatric/Behavioral: Positive for agitation and sleep disturbance. Negative  for confusion, decreased concentration and suicidal ideas. The patient is nervous/anxious. The patient is not hyperactive.    Per HPI unless specifically indicated above     Objective:    BP 109/69   Pulse 76   Temp 97.7 F (36.5 C) (Oral)   Ht 6\' 2"  (1.88 m)   Wt 186 lb 6.4 oz (84.6 kg)   SpO2 97%   BMI 23.93 kg/m   Wt Readings from Last 3 Encounters:  03/29/20 186 lb 6.4 oz (84.6 kg)  12/28/19 175 lb (79.4 kg)  07/05/19 190 lb (86.2 kg)    Physical Exam Vitals and nursing note reviewed.  Constitutional:      General: He is not in acute distress.    Appearance: Normal appearance. He is not toxic-appearing.  Eyes:     General: No scleral icterus.    Extraocular Movements: Extraocular movements intact.  Cardiovascular:     Rate and Rhythm: Normal rate and regular rhythm.     Heart sounds: Normal heart sounds. No murmur.  Pulmonary:     Effort: Pulmonary effort is normal. No respiratory distress.     Breath sounds: Normal breath sounds. No stridor. No wheezing or rhonchi.  Abdominal:     General: Abdomen is flat. Bowel sounds are normal. There is no distension.  Musculoskeletal:        General: Normal range of motion.  Skin:    General: Skin is warm and dry.     Coloration: Skin is not jaundiced or pale.  Neurological:     General: No focal deficit present.     Mental Status: He is alert and oriented to person, place, and time.     Motor: No weakness.     Gait: Gait normal.  Psychiatric:        Mood and Affect: Mood normal.        Behavior: Behavior normal.        Thought Content: Thought content normal.        Assessment & Plan:   Problem List Items Addressed This Visit      Other   Anxiety and depression - Primary    Chronic, ongoing.  Declines therapy for now but willing to start medication, although weary of side effects.  Advised to ramp up medication with 1/2 pill for four days and then start full dose.  With any new symptoms or SI/HI, stop medication  and go to ED.  Follow up in 4 weeks on mood.       Relevant Medications   venlafaxine (EFFEXOR) 75 MG tablet      Follow up plan: Return in about 4 weeks (around 04/26/2020) for mood follow up.

## 2020-11-30 ENCOUNTER — Ambulatory Visit: Payer: Self-pay

## 2020-12-25 ENCOUNTER — Other Ambulatory Visit: Payer: Self-pay

## 2021-06-26 ENCOUNTER — Encounter: Payer: Self-pay | Admitting: Emergency Medicine

## 2021-06-26 ENCOUNTER — Ambulatory Visit
Admission: EM | Admit: 2021-06-26 | Discharge: 2021-06-26 | Disposition: A | Discharge status: Home / Self Care | Payer: Managed Care, Other (non HMO) | Attending: Sports Medicine | Admitting: Sports Medicine

## 2021-06-26 ENCOUNTER — Other Ambulatory Visit: Payer: Self-pay

## 2021-06-26 DIAGNOSIS — Z20822 Contact with and (suspected) exposure to covid-19: Secondary | ICD-10-CM | POA: Diagnosis not present

## 2021-06-26 DIAGNOSIS — J029 Acute pharyngitis, unspecified: Secondary | ICD-10-CM | POA: Diagnosis not present

## 2021-06-26 DIAGNOSIS — R52 Pain, unspecified: Secondary | ICD-10-CM | POA: Diagnosis not present

## 2021-06-26 DIAGNOSIS — B349 Viral infection, unspecified: Secondary | ICD-10-CM | POA: Diagnosis not present

## 2021-06-26 DIAGNOSIS — R5383 Other fatigue: Secondary | ICD-10-CM | POA: Diagnosis not present

## 2021-06-26 DIAGNOSIS — R519 Headache, unspecified: Secondary | ICD-10-CM | POA: Diagnosis present

## 2021-06-26 LAB — GROUP A STREP BY PCR: Group A Strep by PCR: NOT DETECTED

## 2021-06-26 NOTE — ED Provider Notes (Signed)
[facility]    CSN: [id number] Arrival date & time: 06/26/21  4132      History   Chief Complaint Chief Complaint  Patient presents with   Sore Throat   Headache   Fatigue    HPI Anthony Wade is a 25 y.o. male.   Patient is a 25 year old male who presents for evaluation of 2 days of sore throat, fatigue, headache, and a little bit of stiffness in his neck.  He denies any fever shakes chills.  No nausea vomiting or diarrhea.  He did take a home COVID test yesterday that was negative.  He wants to get that repeated.  He is mostly concerned about the sore throat.  He works at [employer].  He does not have a primary care physician.  He denies chest pain or shortness of breath.  No vision changes.  No nausea vomiting or diarrhea.  No abdominal or urinary symptoms.  No red flag signs or symptoms elicited on history.   Past Medical History:  Diagnosis Date   ADD (attention deficit disorder)     Patient Active Problem List   Diagnosis Date Noted   Change in bowel movement 01/30/2020   Chronic back pain 01/30/2020   ADHD (attention deficit hyperactivity disorder) 09/05/2016   Addiction, marijuana (HCC) 07/29/2016   Anxiety and depression 07/15/2016    Past Surgical History:  Procedure Laterality Date   NO PAST SURGERIES         Home Medications    Prior to Admission medications   Medication Sig Start Date End Date Taking? Authorizing Provider  amphetamine-dextroamphetamine (ADDERALL XR) 10 MG 24 hr capsule Take 10 mg by mouth daily. 05/09/21   [provider]  Imiquimod 3.75 % CREA Apply a thin layer once daily prior to bedtime; leave on skin for ~8 hours, then remove with mild soap and water. Continue for up to 8 weeks. Patient not taking: Reported on 03/29/2020 12/28/19   Tommie Sams, DO  saccharomyces boulardii (FLORASTOR) 250 MG capsule Take 1 capsule (250 mg total) by mouth 2 (two) times daily. Patient not taking: Reported on 03/29/2020 01/30/20    Valentino Nose, NP  venlafaxine (EFFEXOR) 75 MG tablet Take 1 tablet (75 mg total) by mouth daily. Start with 1/2 pill for 4 days (37.5 mg) and then take whole pill for rest of bottle. 03/29/20   Valentino Nose, NP    Family History Family History  Problem Relation Age of Onset   Asthma Mother    Mental illness Mother        anxiety, bipolar depression   Intestinal malrotation Mother    Other Father        MVA   ADD / ADHD Sister    ADD / ADHD Brother    Diabetes Maternal Grandmother    Alcohol abuse Maternal Grandfather     Social History Social History   Tobacco Use   Smoking status: Former    Packs/day: 0.25    Types: Cigarettes    Quit date: 02/21/2018    Years since quitting: 3.3   Smokeless tobacco: Never  Vaping Use   Vaping Use: Every day   Substances: Flavoring, Synthetic cannabinoids, Mixture of cannabinoids  Substance Use Topics   Alcohol use: Yes    Comment: On occasion   Drug use: Yes    Frequency: 21.0 times per week    Types: Marijuana     Allergies   Patient has no known allergies.  Review of Systems Review of Systems  Constitutional:  Positive for fatigue. Negative for activity change, appetite change, chills, diaphoresis and fever.  HENT:  Positive for sore throat. Negative for congestion, ear pain, postnasal drip, rhinorrhea, sinus pressure, sinus pain and sneezing.   Eyes:  Negative for pain.  Respiratory:  Negative for cough, chest tightness and shortness of breath.   Cardiovascular:  Negative for chest pain and palpitations.  Gastrointestinal:  Negative for abdominal pain, diarrhea, nausea and vomiting.  Genitourinary:  Negative for dysuria.  Musculoskeletal:  Positive for neck stiffness. Negative for back pain, myalgias and neck pain.  Skin:  Negative for color change, pallor, rash and wound.  Neurological:  Positive for headaches. Negative for dizziness and light-headedness.  All other systems reviewed and are  negative.   Physical Exam Triage Vital Signs ED Triage Vitals  Enc Vitals Group     BP 06/26/21 1004 102/76     Pulse Rate 06/26/21 1004 75     Resp 06/26/21 1004 18     Temp 06/26/21 1004 98.3 F (36.8 C)     Temp Source 06/26/21 1004 Oral     SpO2 06/26/21 1004 98 %     Weight --      Height --      Head Circumference --      Peak Flow --      Pain Score 06/26/21 1001 7     Pain Loc --      Pain Edu? --      Excl. in GC? --    No data found.  Updated Vital Signs BP 102/76 (BP Location: Left Arm)   Pulse 75   Temp 98.3 F (36.8 C) (Oral)   Resp 18   SpO2 98%   Visual Acuity Right Eye Distance:   Left Eye Distance:   Bilateral Distance:    Right Eye Near:   Left Eye Near:    Bilateral Near:     Physical Exam Vitals and nursing note reviewed.  Constitutional:      General: He is not in acute distress.    Appearance: Normal appearance. He is well-developed. He is not ill-appearing, toxic-appearing or diaphoretic.  HENT:     Head: Normocephalic and atraumatic.     Right Ear: Tympanic membrane normal.     Left Ear: Tympanic membrane normal.     Nose: Nose normal. No congestion or rhinorrhea.     Mouth/Throat:     Mouth: Mucous membranes are moist. No oral lesions.     Pharynx: Uvula midline. Posterior oropharyngeal erythema present. No pharyngeal swelling, oropharyngeal exudate or uvula swelling.     Tonsils: No tonsillar exudate or tonsillar abscesses. 0 on the right. 0 on the left.  Eyes:     General: No scleral icterus.    Extraocular Movements: Extraocular movements intact.     Right eye: Normal extraocular motion and no nystagmus.     Left eye: Normal extraocular motion and no nystagmus.     Conjunctiva/sclera: Conjunctivae normal.     Pupils: Pupils are equal, round, and reactive to light. Pupils are equal.  Neck:     Thyroid: No thyromegaly.  Cardiovascular:     Rate and Rhythm: Normal rate and regular rhythm.     Pulses: Normal pulses.     Heart  sounds: Normal heart sounds. No murmur heard.   No friction rub. No gallop.  Pulmonary:     Effort: Pulmonary effort is normal.     Breath  sounds: Normal breath sounds. No stridor. No wheezing, rhonchi or rales.  Musculoskeletal:     Cervical back: Normal, normal range of motion and neck supple. No spasms or tenderness. Normal range of motion.     Comments: Patient has full range of motion of his neck in all planes.  No meningeal signs appreciated on examination.  Lymphadenopathy:     Cervical: Cervical adenopathy present.  Skin:    General: Skin is warm and dry.     Capillary Refill: Capillary refill takes less than 2 seconds.     Coloration: Skin is not jaundiced.     Findings: No erythema, lesion or rash.  Neurological:     General: No focal deficit present.     Mental Status: He is alert and oriented to person, place, and time.     GCS: GCS eye subscore is 4. GCS verbal subscore is 5. GCS motor subscore is 6.     Cranial Nerves: No cranial nerve deficit.     Sensory: No sensory deficit.  Psychiatric:        Mood and Affect: Mood normal.        Speech: Speech normal.        Behavior: Behavior normal.     UC Treatments / Results  Labs (all labs ordered are listed, but only abnormal results are displayed) Labs Reviewed  GROUP A STREP BY PCR  SARS CORONAVIRUS 2 (TAT 6-24 HRS)    EKG   Radiology No results found.  Procedures Procedures (including critical care time)  Medications Ordered in UC Medications - No data to display  Initial Impression / Assessment and Plan / UC Course  I have reviewed the triage vital signs and the nursing notes.  Pertinent labs & imaging results that were available during my care of the patient were reviewed by me and considered in my medical decision making (see chart for details).  Clinical impression: 1.  Sore throat 2.  Headache 3.  Fatigue 4.  Body aches  Treatment plan: 1.  The findings and treatment plan were discussed in  detail with the patient.  Patient was in agreement. 2.  Recommended getting a strep test.  It was negative. 3.  Patient wanted his COVID test repeated.  We went ahead and obtain that and send it to the hospital.  Someone will be in contact with him if he has a positive result. 4.  Gave him a work note keep him out of work today he can return tomorrow assuming his COVID test is negative. 5.  Educational handouts provided. 6.  Plenty of rest, plenty fluids, Tylenol or Motrin for any fever or discomfort. 7.  Since he does not have a PCP, if his symptoms were to worsen that he should go to the ER.  He voiced verbal understanding. 8.  He was stable upon discharge and will follow-up here as needed.    Final Clinical Impressions(s) / UC Diagnoses   Final diagnoses:  Sore throat  Headache due to viral infection  Fatigue, unspecified type  Body aches     Discharge Instructions      As we discussed, your strep test was negative. Your COVID test was sent to the hospital and you can your results through the app on your phone called MyChart. Someone will contact you if your test is positive, they may not always contact you with a negative test result. Please see educational handouts. You should isolate until you know your test results.  If  you are positive then you need to quarantine for at least 5 days per current CDC guidelines. Plenty of rest, plenty fluids, Tylenol or Motrin for any fever or discomfort. I did give you a work note. If your symptoms worsen, since you do not have a primary care provider you should go to the ER.     ED Prescriptions   None    PDMP not reviewed this encounter.   Delton SeeBarnes, Cornelio Parkerson, MD 06/26/21 1840

## 2021-06-26 NOTE — Discharge Instructions (Addendum)
As we discussed, your strep test was negative. Your COVID test was sent to the hospital and you can your results through the app on your phone called MyChart. Someone will contact you if your test is positive, they may not always contact you with a negative test result. Please see educational handouts. You should isolate until you know your test results.  If you are positive then you need to quarantine for at least 5 days per current CDC guidelines. Plenty of rest, plenty fluids, Tylenol or Motrin for any fever or discomfort. I did give you a work note. If your symptoms worsen, since you do not have a primary care provider you should go to the ER.

## 2021-06-26 NOTE — ED Triage Notes (Signed)
Pt presents today with c/o of sore throat, headache and fatigue that began yesterday. Denies fever. Denies n/vd.   Home Covid test taken yesterday, negative.

## 2021-06-27 LAB — SARS CORONAVIRUS 2 (TAT 6-24 HRS): SARS Coronavirus 2: NEGATIVE

## 2021-12-26 ENCOUNTER — Ambulatory Visit: Admit: 2021-12-26 | Discharge status: Against Medical Advice | Payer: Managed Care, Other (non HMO)

## 2023-01-01 ENCOUNTER — Encounter: Payer: Self-pay | Admitting: Emergency Medicine

## 2023-01-01 ENCOUNTER — Ambulatory Visit
Admission: EM | Admit: 2023-01-01 | Discharge: 2023-01-01 | Disposition: A | Discharge status: Home / Self Care | Payer: BC Managed Care – PPO | Attending: Emergency Medicine | Admitting: Emergency Medicine

## 2023-01-01 DIAGNOSIS — M545 Low back pain, unspecified: Secondary | ICD-10-CM

## 2023-01-01 DIAGNOSIS — Z1152 Encounter for screening for COVID-19: Secondary | ICD-10-CM | POA: Insufficient documentation

## 2023-01-01 DIAGNOSIS — J101 Influenza due to other identified influenza virus with other respiratory manifestations: Secondary | ICD-10-CM

## 2023-01-01 LAB — RESP PANEL BY RT-PCR (RSV, FLU A&B, COVID)  RVPGX2
Influenza A by PCR: NEGATIVE
Influenza B by PCR: POSITIVE — AB
Resp Syncytial Virus by PCR: NEGATIVE
SARS Coronavirus 2 by RT PCR: NEGATIVE

## 2023-01-01 MED ORDER — CYCLOBENZAPRINE HCL 10 MG PO TABS: 10.0000 mg | ORAL_TABLET | Freq: Every day | ORAL | 0 refills | Status: DC

## 2023-01-01 MED ORDER — IBUPROFEN 800 MG PO TABS: 800.0000 mg | ORAL_TABLET | Freq: Three times a day (TID) | ORAL | 0 refills | Status: DC | PRN

## 2023-01-01 NOTE — Discharge Instructions (Signed)
Positive for  influenza b, your symptoms are consistent with the current presentation, influenza is a virus and will steadily improve with time    You can take Tylenol and/or Ibuprofen as needed for fever reduction and pain relief.   For cough: honey 1/2 to 1 teaspoon (you can dilute the honey in water or another fluid).  You can also use guaifenesin and dextromethorphan for cough. You can use a humidifier for chest congestion and cough.  If you don't have a humidifier, you can sit in the bathroom with the hot shower running.      For sore throat: try warm salt water gargles, cepacol lozenges, throat spray, warm tea or water with lemon/honey, popsicles or ice, or OTC cold relief medicine for throat discomfort.   For congestion: take a daily anti-histamine like Zyrtec, Claritin, and a oral decongestant, such as pseudoephedrine.  You can also use Flonase 1-2 sprays in each nostril daily.   It is important to stay hydrated: drink plenty of fluids (water, gatorade/powerade/pedialyte, juices, or teas) to keep your throat moisturized and help further relieve irritation/discomfort.    Your pain is most likely caused by irritation to the muscles   Begin use of ibuprofen taking 3 8 hours for the next 5 days, this medicine reduces inflammation and irritation and should ideally lessen your pain with time, may take Tylenol in addition to this  May use muscle relaxer at bedtime for additional comfort, be mindful this may make you feel drowsy  You may use heating pad in 15 minute intervals as needed for additional comfort, within the first 2-3 days you may find comfort in using ice in 10-15 minutes over affected area  Begin stretching affected area daily for 10 minutes as tolerated to further loosen muscles   When lying down place pillow underneath and between knees for support  Can try sleeping without pillow on firm mattress   Practice good posture: head back, shoulders back, chest forward, pelvis back  and weight distributed evenly on both legs  If pain persist after recommended treatment or reoccurs if may be beneficial to follow up with orthopedic specialist for evaluation, this doctor specializes in the bones and can manage your symptoms long-term with options such as but not limited to imaging, medications or physical therapy

## 2023-01-01 NOTE — ED Triage Notes (Signed)
Pt presents with back pain x 2 days after pulling a pallet. He also c/o cough, chills, and diarrhea since yesterday.

## 2023-01-01 NOTE — ED Provider Notes (Signed)
[facility]    CSN: [id number] Arrival date & time: 01/01/23  1102      History   Chief Complaint Chief Complaint  Patient presents with   Cough   Chills   Diarrhea   Back Pain   Fatigue    HPI Anthony Wade is a 27 y.o. male.   Patient presents for evaluation of chills, nasal congestion, rhinorrhea, nonproductive cough and diarrhea with nausea beginning 1 day ago.  Last occurrence of diarrhea approximately 5 AM today, described as watery.  Tolerating food and liquids.  Has attempted use of Aleve.  Known sick contact.  Denies shortness of breath, wheezing, ear pain or sore throat.  Patient concerned with centralized lower back pain beginning 2 days ago after lifting a pallet pillow.  Feels as if he pulled a muscle in symptoms or worsen when sitting erect and changing positions with bending.  Associated tingling directly at the area of concern.  Pain does not radiate.  Denies numbness, urinary or bowel incontinence.    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 Authorizing Provider  amphetamine-dextroamphetamine (ADDERALL XR) 10 MG 24 hr capsule Take 10 mg by mouth daily. 05/09/21   [provider]  Imiquimod 3.75 % CREA Apply a thin layer once daily prior to bedtime; leave on skin for ~8 hours, then remove with mild soap and water. Continue for up to 8 weeks. Patient not taking: Reported on 03/29/2020 12/28/19   Coral Spikes, DO  saccharomyces boulardii (FLORASTOR) 250 MG capsule  Take 1 capsule (250 mg total) by mouth 2 (two) times daily. Patient not taking: Reported on 03/29/2020 01/30/20   Eulogio Bear, NP  venlafaxine (EFFEXOR) 75 MG tablet Take 1 tablet (75 mg total) by mouth daily. Start with 1/2 pill for 4 days (37.5 mg) and then take whole pill for rest of bottle. 03/29/20   Eulogio Bear, NP    Family History Family History  Problem Relation Age of Onset   Asthma Mother    Mental illness Mother        anxiety, bipolar depression   Intestinal malrotation Mother    Other Father        MVA   ADD / ADHD Sister    ADD / ADHD Brother    Diabetes Maternal Grandmother    Alcohol abuse Maternal Grandfather     Social History Social History   Tobacco Use   Smoking status: Former    Packs/day: 0.25    Types: Cigarettes    Quit date: 02/21/2018    Years since quitting: 4.8   Smokeless tobacco: Never  Vaping Use   Vaping Use: Every day   Substances: Flavoring, Synthetic cannabinoids, Mixture of cannabinoids  Substance Use Topics   Alcohol use: Yes    Comment: On occasion   Drug use: Yes    Frequency: 21.0 times per week    Types: Marijuana     Allergies  Patient has no known allergies.   Review of Systems Review of Systems  Constitutional:  Positive for chills. Negative for activity change, appetite change, diaphoresis, fatigue, fever and unexpected weight change.  HENT:  Positive for congestion and rhinorrhea. Negative for dental problem, drooling, ear discharge, ear pain, facial swelling, hearing loss, mouth sores, nosebleeds, postnasal drip, sinus pressure, sinus pain, sneezing, sore throat, tinnitus, trouble swallowing and voice change.   Respiratory:  Positive for cough. Negative for apnea, choking, chest tightness, shortness of breath, wheezing and stridor.   Cardiovascular: Negative.   Gastrointestinal:  Positive for diarrhea and nausea. Negative for abdominal distention, abdominal pain, anal bleeding, blood in stool,  constipation, rectal pain and vomiting.  Musculoskeletal:  Positive for back pain. Negative for arthralgias, gait problem, joint swelling, myalgias, neck pain and neck stiffness.  Skin: Negative.      Physical Exam Triage Vital Signs ED Triage Vitals  Enc Vitals Group     BP 01/01/23 1125 138/82     Pulse Rate 01/01/23 1125 73     Resp 01/01/23 1125 18     Temp 01/01/23 1125 98.8 F (37.1 C)     Temp Source 01/01/23 1125 Oral     SpO2 01/01/23 1125 96 %     Weight --      Height --      Head Circumference --      Peak Flow --      Pain Score 01/01/23 1123 0     Pain Loc --      Pain Edu? --      Excl. in [place]? --    No data found.  Updated Vital Signs BP 138/82 (BP Location: Right Arm)   Pulse 73   Temp 98.8 F (37.1 C) (Oral)   Resp 18   SpO2 96%   Visual Acuity Right Eye Distance:   Left Eye Distance:   Bilateral Distance:    Right Eye Near:   Left Eye Near:    Bilateral Near:     Physical Exam Constitutional:      Appearance: Normal appearance.  HENT:     Head: Normocephalic.     Right Ear: Tympanic membrane, ear canal and external ear normal.     Left Ear: Tympanic membrane, ear canal and external ear normal.     Nose: Congestion present. No rhinorrhea.     Mouth/Throat:     Mouth: Mucous membranes are moist.     Pharynx: Posterior oropharyngeal erythema present.  Cardiovascular:     Rate and Rhythm: Regular rhythm. Tachycardia present.     Pulses: Normal pulses.     Heart sounds: Normal heart sounds.  Pulmonary:     Effort: Pulmonary effort is normal.     Breath sounds: Normal breath sounds.  Musculoskeletal:     Comments: Unable to reproduce tenderness on exam, no ecchymosis, swelling or deformity, able to sit erect but pain is elicited, able to twist and turn, able to bend but pain is elicited at about 45 degrees, positive straight leg test, strength 5 out of 5  Neurological:     Mental Status: He is alert and oriented to person, place, and time.  Mental status is at baseline.      UC Treatments / Results  Labs (all labs ordered are listed, but only abnormal results are displayed) Labs Reviewed  RESP PANEL BY RT-PCR (RSV, FLU A&B, COVID)  RVPGX2 - Abnormal; Notable for the following components:      Result  Value   Influenza B by PCR POSITIVE (*)    All other components within normal limits    EKG   Radiology No results found.  Procedures Procedures (including critical care time)  Medications Ordered in UC Medications - No data to display  Initial Impression / Assessment and Plan / UC Course  I have reviewed the triage vital signs and the nursing notes.  Pertinent labs & imaging results that were available during my care of the patient were reviewed by me and considered in my medical decision making (see chart for details).  Influenza B, acute midline low back pain without sciatica  Patient is in no signs of distress nor toxic appearing.  Vital signs are stable.  Low suspicion for pneumonia, pneumothorax or bronchitis and therefore will defer imaging.  COVID and RSV negative, positive for tach, discussed with patient, declined use of Tamiflu. May use additional over-the-counter medications as needed for supportive care.  May follow-up with urgent care as needed if symptoms persist or worsen.  Etiology of back pain is most likely muscular, low suspicion of true spinal involvement, no tenderness in therefore imaging deferred, discussed with patient, with all injection in office, prescribed ibuprofen and recommended Flexeril for outpatient use, recommended RICE, heat, massage, stretching and activity as tolerated, may follow-up as needed Note given.   Final Clinical Impressions(s) / UC Diagnoses   Final diagnoses:  None   Discharge Instructions   None    ED Prescriptions   None    PDMP not reviewed this encounter.   Valinda Hoar, NP 01/01/23 1229

## 2024-05-28 ENCOUNTER — Ambulatory Visit
Admission: EM | Admit: 2024-05-28 | Discharge: 2024-05-28 | Disposition: A | Discharge status: Home / Self Care | Attending: Family Medicine | Admitting: Family Medicine

## 2024-05-28 DIAGNOSIS — S39012A Strain of muscle, fascia and tendon of lower back, initial encounter: Secondary | ICD-10-CM

## 2024-05-28 MED ORDER — MELOXICAM 15 MG PO TABS: 15.0000 mg | ORAL_TABLET | Freq: Every day | ORAL | 0 refills | Status: DC | PRN

## 2024-05-28 MED ORDER — BACLOFEN 10 MG PO TABS: 5.0000 mg | ORAL_TABLET | Freq: Three times a day (TID) | ORAL | 0 refills | Status: DC | PRN

## 2024-05-28 NOTE — ED Triage Notes (Signed)
 Pt c/o lower back pain   Pt believed that he hurt his back last week at work. 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

## 2024-05-28 NOTE — Discharge Instructions (Signed)
Rest, heat.  Medication as prescribed.  Take care  Dr. Adrianne Shackleton  

## 2024-05-28 NOTE — ED Provider Notes (Signed)
 [facility]    CSN: [id number] Arrival date & time: 05/28/24  1119      History   Chief Complaint Chief Complaint  Patient presents with   Back Pain    HPI Anthony Wade is a 28 y.o. male presents for evaluation of low back pain.  This is a [employer]er, [employer]. injury.  He was at work this morning and lifted a 30 pound cannabis oil and injured his back.  Right sided.  Thoracolumbar.  States that he feels like he pulled something.  Worse with range of motion.  No relieving factors.  No radicular symptoms.  No other complaints.  Past Medical History:  Diagnosis Date   ADD (attention deficit disorder)     Patient Active Problem List   Diagnosis Date Noted   Change in bowel movement 01/30/2020   Chronic back pain 01/30/2020   ADHD (attention deficit hyperactivity disorder) 09/05/2016   Addiction, marijuana ([facility]) 07/29/2016   Anxiety and depression 07/15/2016    Past Surgical History:  Procedure Laterality Date   NO PAST SURGERIES         Home Medications    Prior to Admission medications   Medication Sig Start Date End Date Taking? Authorizing Provider  baclofen (LIORESAL) 10 MG tablet Take 0.5-1 tablets (5-10 mg total) by mouth 3 (three) times daily as needed for muscle spasms. 05/28/24  Yes Jahad Old G, DO  meloxicam  (MOBIC ) 15 MG tablet Take 1 tablet (15 mg total) by mouth daily as needed for pain. 05/28/24  Yes Chandani Rogowski G, DO  amphetamine-dextroamphetamine (ADDERALL XR) 10 MG 24 hr capsule Take 10 mg by mouth daily. 05/09/21   [provider]  cyclobenzaprine  (FLEXERIL ) 10 MG tablet Take 1 tablet (10 mg total) by mouth at bedtime. 01/01/23   Teresa Shelba SAUNDERS, NP  venlafaxine  (EFFEXOR ) 75 MG tablet Take 1 tablet (75 mg total) by mouth daily. Start with 1/2 pill for 4 days (37.5 mg) and then take whole pill for rest of bottle. 03/29/20   Chandra Harlene LABOR, NP    Family History Family History  Problem Relation Age of Onset   Asthma Mother     Mental illness Mother        anxiety, bipolar depression   Intestinal malrotation Mother    Other Father        MVA   ADD / ADHD Sister    ADD / ADHD Brother    Diabetes Maternal Grandmother    Alcohol abuse Maternal Grandfather     Social History Social History   Tobacco Use   Smoking status: Former    Current packs/day: 0.00    Types: Cigarettes    Quit date: 02/21/2018    Years since quitting: 6.2   Smokeless tobacco: Never  Vaping Use   Vaping status: Every Day   Substances: Flavoring, Synthetic cannabinoids, Mixture of cannabinoids  Substance Use Topics   Alcohol use: Yes    Comment: On occasion   Drug use: Yes    Frequency: 21.0 times per week    Types: Marijuana     Allergies   Patient has no known allergies.   Review of Systems Review of Systems  Musculoskeletal:  Positive for back pain.     Physical Exam Triage Vital Signs ED Triage Vitals  Encounter Vitals Group     BP 05/28/24 1133 118/77     Girls Systolic BP Percentile --      Girls Diastolic BP Percentile --  Boys Systolic BP Percentile --      Boys Diastolic BP Percentile --      Pulse Rate 05/28/24 1133 96     Resp --      Temp 05/28/24 1133 98.6 F (37 C)     Temp Source 05/28/24 1133 Oral     SpO2 05/28/24 1133 97 %     Weight 05/28/24 1135 190 lb (86.2 kg)     Height 05/28/24 1135 6' 4 (1.93 m)     Head Circumference --      Peak Flow --      Pain Score 05/28/24 1134 7     Pain Loc --      Pain Education --      Exclude from Growth Chart --    No data found.  Updated Vital Signs BP 118/77 (BP Location: Left Arm)   Pulse 96   Temp 98.6 F (37 C) (Oral)   Ht 6' 4 (1.93 m)   Wt 86.2 kg   SpO2 97%   BMI 23.13 kg/m   Visual Acuity Right Eye Distance:   Left Eye Distance:   Bilateral Distance:    Right Eye Near:   Left Eye Near:    Bilateral Near:     Physical Exam Constitutional:      General: He is not in acute distress.    Appearance: Normal  appearance.  HENT:     Head: Normocephalic and atraumatic.   Cardiovascular:     Rate and Rhythm: Normal rate and regular rhythm.  Pulmonary:     Effort: Pulmonary effort is normal.     Breath sounds: Normal breath sounds.   Musculoskeletal:       Back:     Comments: Tenderness as labeled location.   Neurological:     Mental Status: He is alert.      UC Treatments / Results  Labs (all labs ordered are listed, but only abnormal results are displayed) Labs Reviewed - No data to display  EKG   Radiology No results found.  Procedures Procedures (including critical care time)  Medications Ordered in UC Medications - No data to display  Initial Impression / Assessment and Plan / UC Course  I have reviewed the triage vital signs and the nursing notes.  Pertinent labs & imaging results that were available during my care of the patient were reviewed by me and considered in my medical decision making (see chart for details).    28 year old male presents with back strain. Rest, heat.  Baclofen and meloxicam  as directed.  Supportive care.  Work note given.  Final Clinical Impressions(s) / UC Diagnoses   Final diagnoses:  Back strain, initial encounter     Discharge Instructions      Rest, heat.  Medication as prescribed.  Take care  Dr. Bluford    ED Prescriptions     Medication Sig Dispense Auth. Provider   baclofen (LIORESAL) 10 MG tablet Take 0.5-1 tablets (5-10 mg total) by mouth 3 (three) times daily as needed for muscle spasms. 30 each Syeda Prickett G, DO   meloxicam  (MOBIC ) 15 MG tablet Take 1 tablet (15 mg total) by mouth daily as needed for pain. 30 tablet Zhaniya Swallows G, DO      PDMP not reviewed this encounter.   Laurelin Elson G, [place] 05/28/24 1346

## 2024-11-12 ENCOUNTER — Ambulatory Visit
Admission: EM | Admit: 2024-11-12 | Discharge: 2024-11-12 | Disposition: A | Discharge status: Home / Self Care | Source: Ambulatory Visit | Attending: Physician Assistant | Admitting: Physician Assistant

## 2024-11-12 DIAGNOSIS — S39012A Strain of muscle, fascia and tendon of lower back, initial encounter: Secondary | ICD-10-CM

## 2024-11-12 DIAGNOSIS — M545 Low back pain, unspecified: Secondary | ICD-10-CM

## 2024-11-12 MED ORDER — CYCLOBENZAPRINE HCL 10 MG PO TABS: 10.0000 mg | ORAL_TABLET | Freq: Every evening | ORAL | 0 refills | Status: AC | PRN

## 2024-11-12 MED ORDER — NAPROXEN 500 MG PO TABS: 500.0000 mg | ORAL_TABLET | Freq: Two times a day (BID) | ORAL | 0 refills | Status: AC | PRN

## 2024-11-12 NOTE — ED Provider Notes (Signed)
 [facility]    CSN: [id number] Arrival date & time: 11/12/24  0827      History   Chief Complaint Chief Complaint  Patient presents with   Back Pain    HPI Anthony Wade is a 28 y.o. male presenting for lower back pain that began yesterday.  Pain does not radiate.  No lower extremity pain, numbness/tingling or weakness.  He states he did not injure himself.  He reports noticing the pain when he was stretching yesterday.  He thinks he slept wrong.  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  Increased pain with forward flexion and extension of back.  No weakness, falls.  No report of bowel or bladder incontinence.  Has not been taking any OTC meds.  History of back pain.  Will need a work note for today.  HPI  Past Medical History:  Diagnosis Date   ADD (attention deficit disorder)     Patient Active Problem List   Diagnosis Date Noted   Change in bowel movement 01/30/2020   Chronic back pain 01/30/2020   ADHD (attention deficit hyperactivity disorder) 09/05/2016   Addiction, marijuana ([facility]) 07/29/2016   Anxiety and depression 07/15/2016    Past Surgical History:  Procedure Laterality Date   NO PAST SURGERIES         Home Medications    Prior to Admission medications   Medication Sig Start Date End Date Taking? Authorizing Provider  cyclobenzaprine  (FLEXERIL ) 10 MG tablet Take 1 tablet (10 mg total) by mouth at bedtime as needed for muscle spasms. 11/12/24  Yes Arvis Jolan NOVAK, PA-C  naproxen  (NAPROSYN ) 500 MG tablet Take 1 tablet (500 mg total) by mouth 2 (two) times daily as needed (pain). 11/12/24  Yes Arvis Jolan NOVAK, PA-C  amphetamine-dextroamphetamine (ADDERALL XR) 10 MG 24 hr capsule Take 10 mg by mouth daily. 05/09/21   [provider]  venlafaxine  (EFFEXOR ) 75 MG tablet Take 1 tablet (75 mg total) by mouth daily. Start with 1/2 pill for 4 days (37.5 mg) and then take whole pill for rest of bottle. 03/29/20   Chandra Harlene LABOR, NP     Family History Family History  Problem Relation Age of Onset   Asthma Mother    Mental illness Mother        anxiety, bipolar depression   Intestinal malrotation Mother    Other Father        MVA   ADD / ADHD Sister    ADD / ADHD Brother    Diabetes Maternal Grandmother    Alcohol abuse Maternal Grandfather     Social History Social History   Tobacco Use   Smoking status: Former    Current packs/day: 0.00    Types: Cigarettes    Quit date: 02/21/2018    Years since quitting: 6.7   Smokeless tobacco: Never  Vaping Use   Vaping status: Every Day   Substances: Flavoring, Synthetic cannabinoids, Mixture of cannabinoids  Substance Use Topics   Alcohol use: Yes    Comment: On occasion   Drug use: Yes    Frequency: 21.0 times per week    Types: Marijuana     Allergies   Patient has no known allergies.   Review of Systems Review of Systems  Musculoskeletal:  Positive for back pain. Negative for arthralgias, gait problem and joint swelling.  Skin:  Negative for color change, rash and wound.  Neurological:  Negative for weakness and numbness.     Physical Exam  Triage Vital Signs ED Triage Vitals  Encounter Vitals Group     BP 11/12/24 0841 121/78     Girls Systolic BP Percentile --      Girls Diastolic BP Percentile --      Boys Systolic BP Percentile --      Boys Diastolic BP Percentile --      Pulse Rate 11/12/24 0841 78     Resp 11/12/24 0841 18     Temp 11/12/24 0841 98 F (36.7 C)     Temp Source 11/12/24 0841 Oral     SpO2 11/12/24 0841 97 %     Weight 11/12/24 0842 194 lb 12.8 oz (88.4 kg)     Height --      Head Circumference --      Peak Flow --      Pain Score 11/12/24 0841 6     Pain Loc --      Pain Education --      Exclude from Growth Chart --    No data found.  Updated Vital Signs BP 121/78 (BP Location: Left Arm)   Pulse 78   Temp 98 F (36.7 C) (Oral)   Resp 18   Wt 194 lb 12.8 oz (88.4 kg)   SpO2 97%   BMI 23.71 kg/m        Physical Exam Vitals and nursing note reviewed.  Constitutional:      General: He is not in acute distress.    Appearance: Normal appearance. He is well-developed. He is not ill-appearing.  HENT:     Head: Normocephalic and atraumatic.  Eyes:     Conjunctiva/sclera: Conjunctivae normal.  Cardiovascular:     Rate and Rhythm: Normal rate.  Pulmonary:     Effort: Pulmonary effort is normal. No respiratory distress.  Musculoskeletal:     Cervical back: Neck supple.     Lumbar back: Tenderness present. Decreased range of motion.       Back:     Comments: TTP upper lumbar midline and left paravertebral muscles. Painful flexion and extension.  Skin:    General: Skin is warm and dry.     Capillary Refill: Capillary refill takes less than 2 seconds.  Neurological:     General: No focal deficit present.     Mental Status: He is alert. Mental status is at baseline.     Motor: No weakness.     Gait: Gait normal.  Psychiatric:        Mood and Affect: Mood normal.        Behavior: Behavior normal.      UC Treatments / Results  Labs (all labs ordered are listed, but only abnormal results are displayed) Labs Reviewed - No data to display  EKG   Radiology No results found.  Procedures Procedures (including critical care time)  Medications Ordered in UC Medications - No data to display  Initial Impression / Assessment and Plan / UC Course  I have reviewed the triage vital signs and the nursing notes.  Pertinent labs & imaging results that were available during my care of the patient were reviewed by me and considered in my medical decision making (see chart for details).   28 y/o male presents for onset of lumbar back pain yesterday.  No injury.  Has not taken any OTC meds.  Exam today consistent with lumbar strain.  Sent naproxen  and cyclobenzaprine  for bedtime use.  Advised heating pad, ice, stretching gently.  Reviewed return and ER precautions.  Work note given  for today.   Final Clinical Impressions(s) / UC Diagnoses   Final diagnoses:  Acute midline low back pain without sciatica  Strain of lumbar region, initial encounter     Discharge Instructions      BACK PAIN: Stressed avoiding painful activities . RICE (REST, ICE, COMPRESSION, ELEVATION) guidelines reviewed. May alternate ice and heat. Consider use of muscle rubs, Salonpas patches, etc. Use medications as directed including muscle relaxers if prescribed. Take anti-inflammatory medications as prescribed or OTC NSAIDs/Tylenol.  F/u with PCP in 7-10 days for reexamination, and please feel free to call or return to the urgent care at any time for any questions or concerns you may have and we will be happy to help you!   BACK PAIN RED FLAGS: If the back pain acutely worsens or there are any red flag symptoms such as numbness/tingling, leg weakness, saddle anesthesia, or loss of bowel/bladder control, go immediately to the ER. Follow up with us  as scheduled or sooner if the pain does not begin to resolve or if it worsens before the follow up       ED Prescriptions     Medication Sig Dispense Auth. Provider   cyclobenzaprine  (FLEXERIL ) 10 MG tablet Take 1 tablet (10 mg total) by mouth at bedtime as needed for muscle spasms. 10 tablet Arvis Huxley B, PA-C   naproxen  (NAPROSYN ) 500 MG tablet Take 1 tablet (500 mg total) by mouth 2 (two) times daily as needed (pain). 20 tablet Agam Davenport B, PA-C      PDMP not reviewed this encounter.   Arvis Huxley NOVAK, PA-C 11/12/24 [phone]

## 2024-11-12 NOTE — Discharge Instructions (Signed)
 BACK PAIN: Stressed avoiding painful activities . RICE (REST, ICE, COMPRESSION, ELEVATION) guidelines reviewed. May alternate ice and heat. Consider use of muscle rubs, Salonpas patches, etc. Use medications as directed including muscle relaxers if prescribed. Take anti-inflammatory medications as prescribed or OTC NSAIDs/Tylenol.  F/u with PCP in 7-10 days for reexamination, and please feel free to call or return to the urgent care at any time for any questions or concerns you may have and we will be happy to help you!   BACK PAIN RED FLAGS: If the back pain acutely worsens or there are any red flag symptoms such as numbness/tingling, leg weakness, saddle anesthesia, or loss of bowel/bladder control, go immediately to the ER. Follow up with [place] as scheduled or sooner if the pain does not begin to resolve or if it worsens before the follow up

## 2024-11-12 NOTE — ED Triage Notes (Signed)
 Pt present mid-lower back pain, pain started yesterday. Pt states it hurts to bend over.
